# Patient Record
Sex: Male | Born: 1935 | Race: White | Hispanic: No | Marital: Married | Smoking: Never smoker
Health system: Southern US, Community
[De-identification: ages and names within clinical notes are randomized; demographics above are authoritative.]

## PROBLEM LIST (undated history)

## (undated) DIAGNOSIS — M109 Gout, unspecified: Secondary | ICD-10-CM

## (undated) DIAGNOSIS — I10 Essential (primary) hypertension: Secondary | ICD-10-CM

## (undated) DIAGNOSIS — F419 Anxiety disorder, unspecified: Secondary | ICD-10-CM

## (undated) DIAGNOSIS — C4491 Basal cell carcinoma of skin, unspecified: Secondary | ICD-10-CM

## (undated) DIAGNOSIS — R011 Cardiac murmur, unspecified: Secondary | ICD-10-CM

## (undated) DIAGNOSIS — I639 Cerebral infarction, unspecified: Secondary | ICD-10-CM

## (undated) HISTORY — DX: Basal cell carcinoma of skin, unspecified: C44.91

## (undated) HISTORY — DX: Gout, unspecified: M10.9

## (undated) HISTORY — PX: TOTAL HIP ARTHROPLASTY: SHX124

## (undated) HISTORY — DX: Anxiety disorder, unspecified: F41.9

## (undated) HISTORY — PX: HERNIA REPAIR: SHX51

## (undated) HISTORY — DX: Cardiac murmur, unspecified: R01.1

## (undated) HISTORY — DX: Essential (primary) hypertension: I10

## (undated) HISTORY — DX: Cerebral infarction, unspecified: I63.9

---

## 2004-12-31 ENCOUNTER — Other Ambulatory Visit: Payer: Self-pay

## 2004-12-31 ENCOUNTER — Emergency Department: Payer: Self-pay | Admitting: Unknown Physician Specialty

## 2006-10-22 ENCOUNTER — Emergency Department: Payer: Self-pay | Admitting: Emergency Medicine

## 2006-10-25 ENCOUNTER — Ambulatory Visit: Payer: Self-pay | Admitting: Family Medicine

## 2007-03-25 ENCOUNTER — Ambulatory Visit: Payer: Self-pay | Admitting: Unknown Physician Specialty

## 2007-03-25 ENCOUNTER — Other Ambulatory Visit: Payer: Self-pay

## 2007-04-12 ENCOUNTER — Ambulatory Visit: Payer: Self-pay | Admitting: Internal Medicine

## 2008-01-06 LAB — HM COLONOSCOPY: HM Colonoscopy: NORMAL

## 2008-04-07 ENCOUNTER — Emergency Department: Payer: Self-pay | Admitting: Emergency Medicine

## 2008-05-08 ENCOUNTER — Inpatient Hospital Stay: Payer: Self-pay | Admitting: Internal Medicine

## 2008-05-14 ENCOUNTER — Emergency Department: Payer: Self-pay | Admitting: Internal Medicine

## 2008-12-24 ENCOUNTER — Emergency Department: Payer: Self-pay | Admitting: Internal Medicine

## 2009-01-11 ENCOUNTER — Inpatient Hospital Stay: Payer: Self-pay | Admitting: Internal Medicine

## 2009-02-09 ENCOUNTER — Ambulatory Visit: Payer: Self-pay | Admitting: Internal Medicine

## 2009-03-09 ENCOUNTER — Ambulatory Visit: Payer: Self-pay | Admitting: Internal Medicine

## 2009-03-12 ENCOUNTER — Ambulatory Visit: Payer: Self-pay | Admitting: Internal Medicine

## 2009-04-11 ENCOUNTER — Ambulatory Visit: Payer: Self-pay | Admitting: Internal Medicine

## 2009-06-02 ENCOUNTER — Emergency Department: Payer: Self-pay | Admitting: Internal Medicine

## 2010-05-12 DIAGNOSIS — C4491 Basal cell carcinoma of skin, unspecified: Secondary | ICD-10-CM

## 2010-05-12 HISTORY — DX: Basal cell carcinoma of skin, unspecified: C44.91

## 2010-07-28 IMAGING — CT CT HEAD WITHOUT CONTRAST
2 series · 16 of 30 positions shown, 20 images · non-contrast
Comparison: none

REASON FOR EXAM: Syncope
COMMENTS:

[Series 2: without · axial · non-contrast · 0.42mm/px · z∈[-44,+80]mm · 13 of 31 slices shown, 17 images]
[im 3/31  brain]
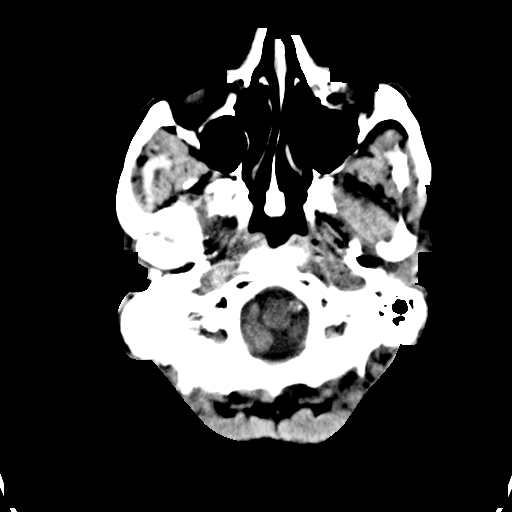
[im 3/31  bone]
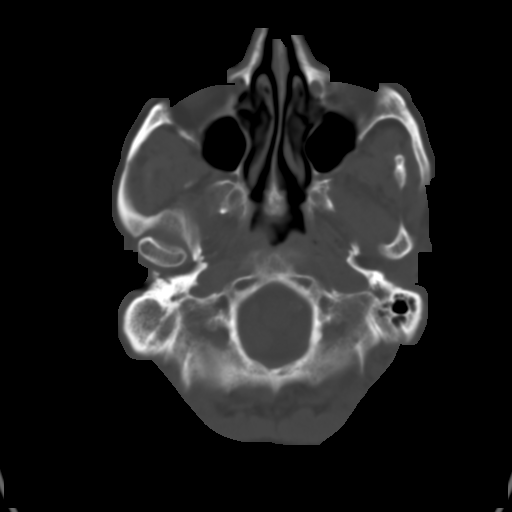
[im 5/31  brain]
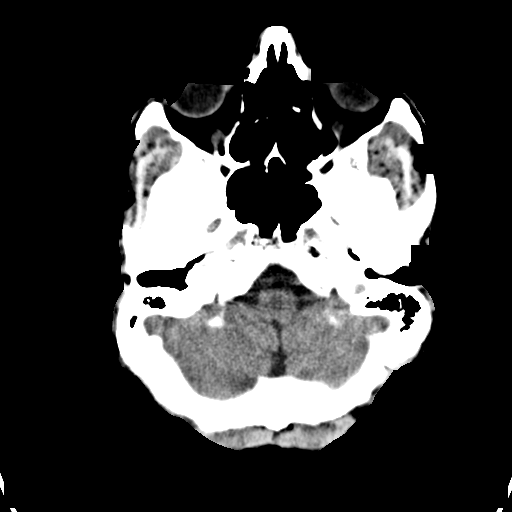
[im 7/31  brain]
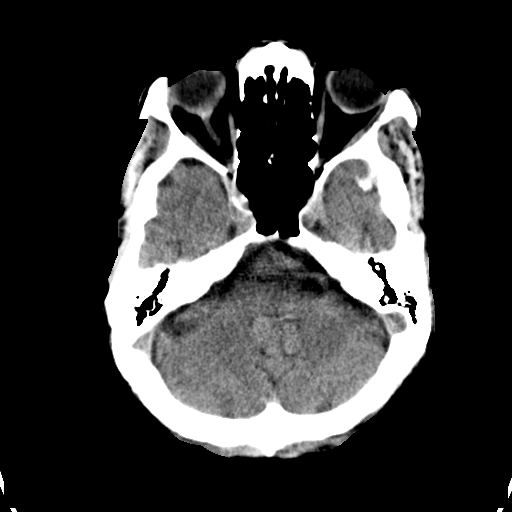
[im 9/31  brain]
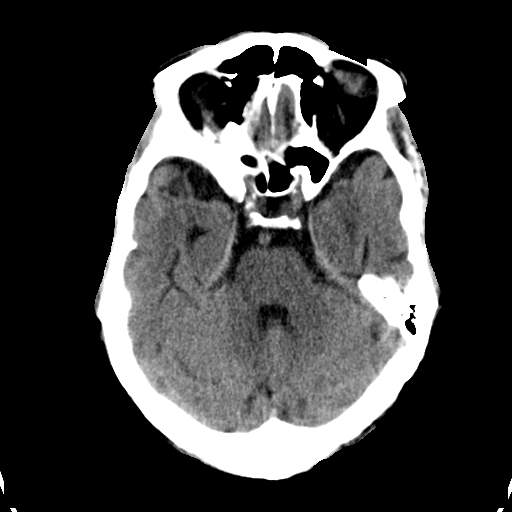
[im 11/31  brain]
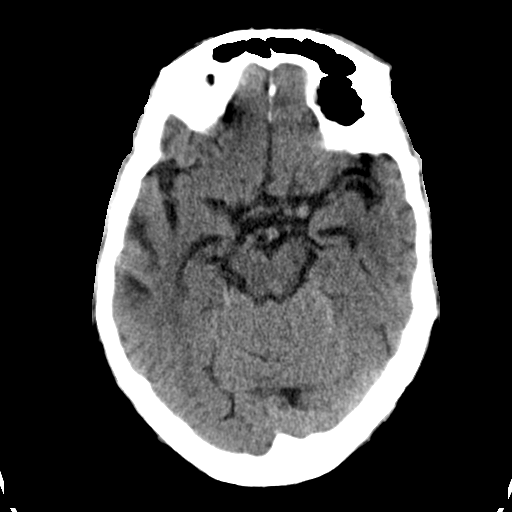
[im 11/31  bone]
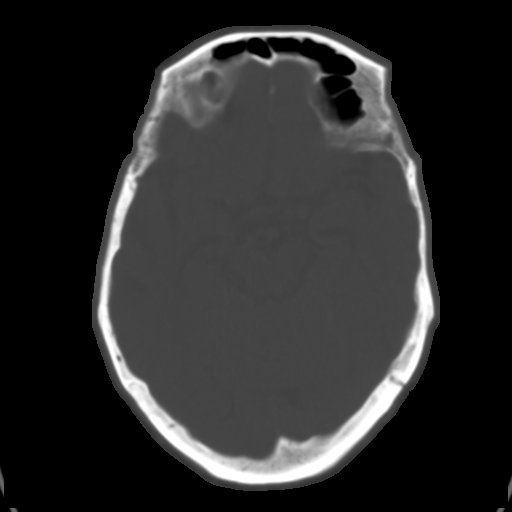
[im 13/31  brain]
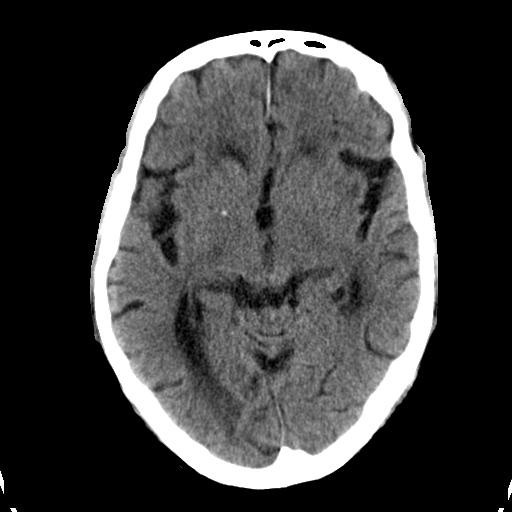
[im 16/31  brain]
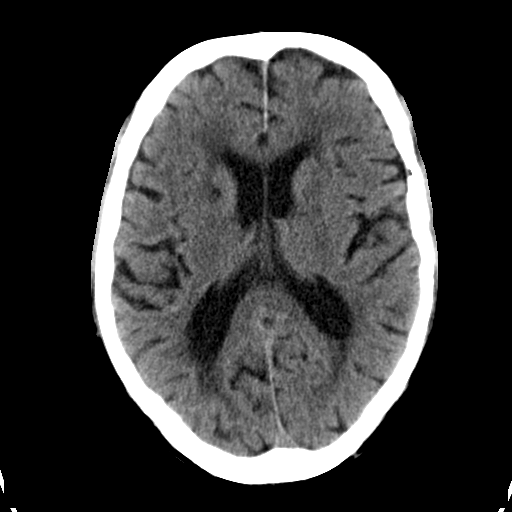
[im 18/31  brain]
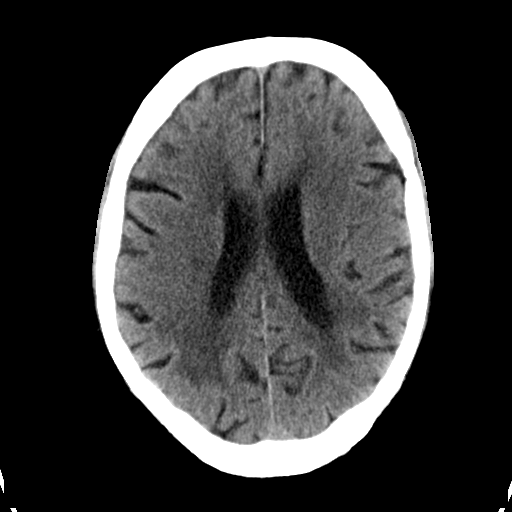
[im 20/31  brain]
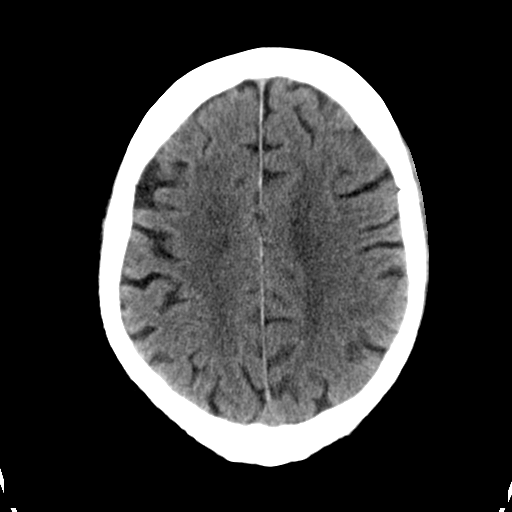
[im 20/31  bone]
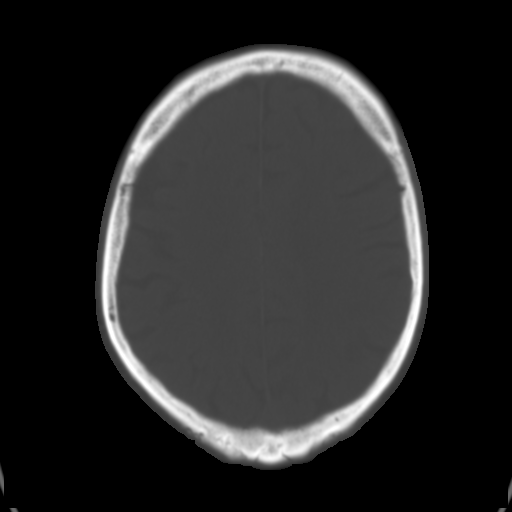
[im 22/31  brain]
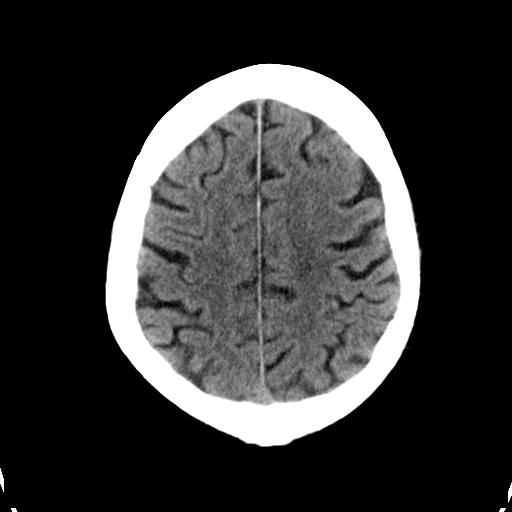
[im 24/31  brain]
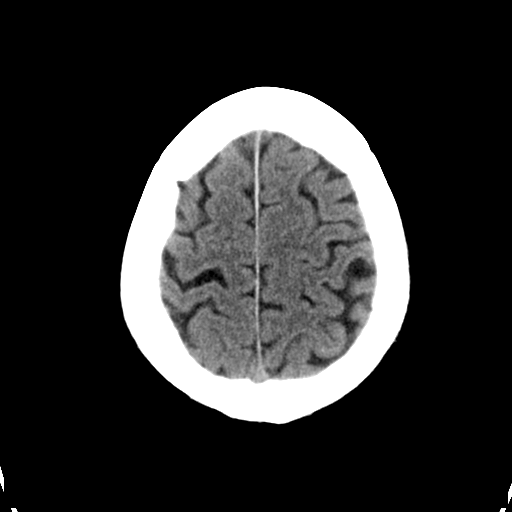
[im 26/31  brain]
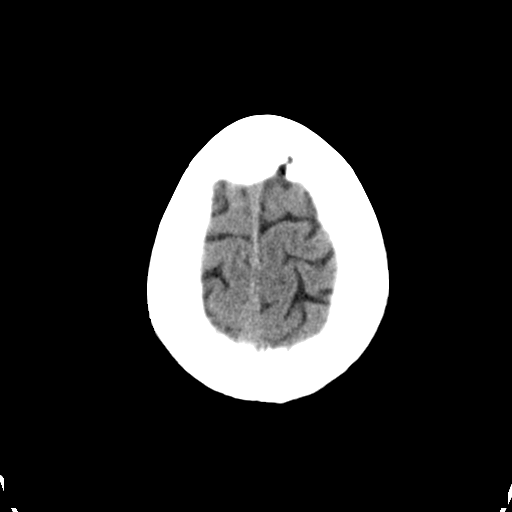
[im 28/31  brain]
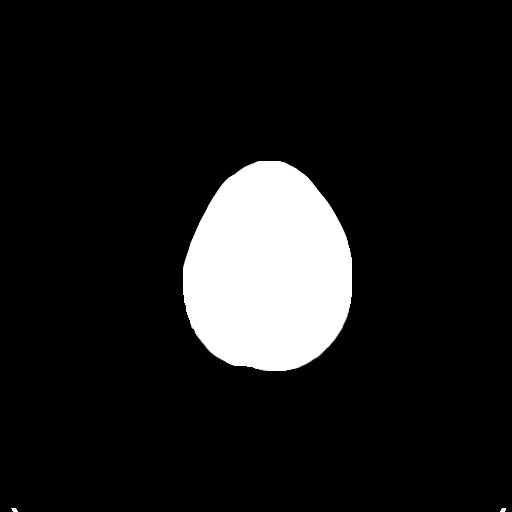
[im 28/31  bone]
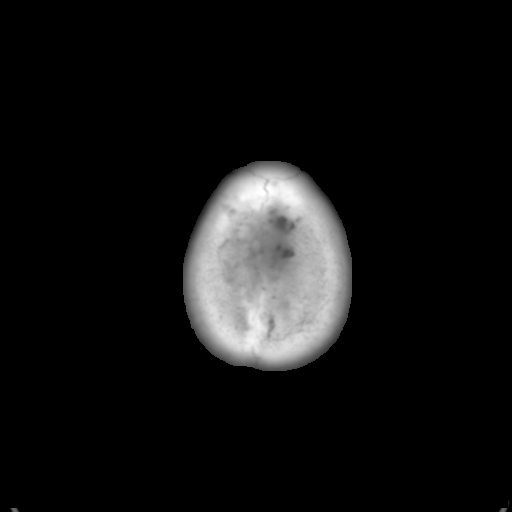

[Series 3: bone · axial · 0.42mm/px · z∈[-44,-4]mm · 3 of 31 slices shown]
[im 3/31  bone]
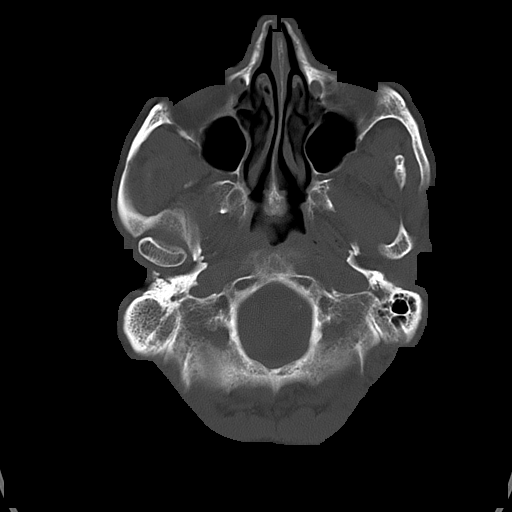
[im 7/31  bone]
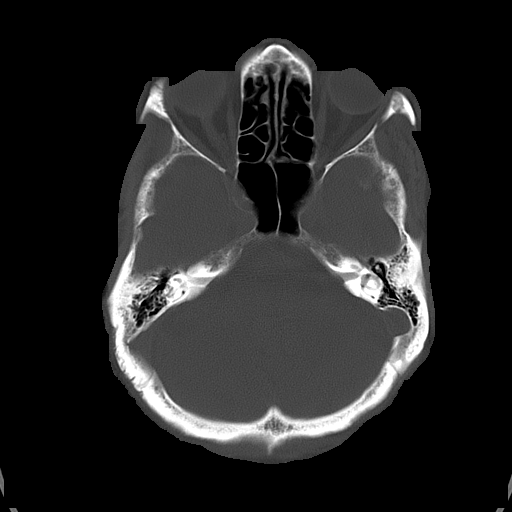
[im 11/31  bone]
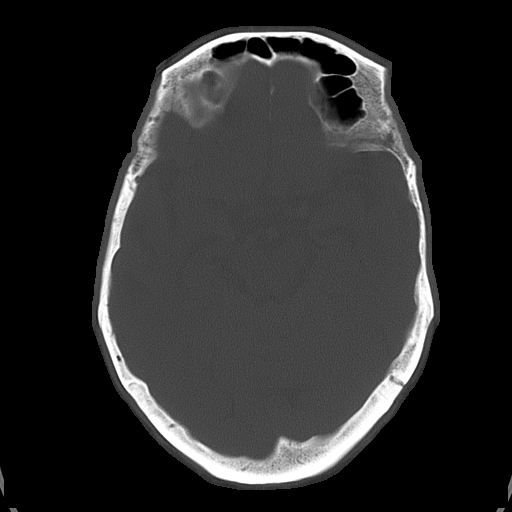

[16 of 30 positions shown; findings below may reference images not displayed]

PROCEDURE:     CT  - CT HEAD WITHOUT CONTRAST  - April 07, 2008  [DATE]

RESULT:     There is no evidence of intra-axial or extra-axial fluid
collections or evidence of acute hemorrhage. Areas of low attenuation
project within the subcortical deep and periventricular white matter
regions. There is mild diffuse cortical atrophy. The visualized bony
skeleton demonstrates no evidence of fracture or dislocation.
IMPRESSION: 1.     Involutional changes as described above.
2.     Note, not mentioned above, is a small focal punctate area of low
attenuation projecting within the RIGHT basal ganglia likely representing
the sequela of a chronic lacunar infarction.
3.     Chronic changes.
4.     No evidence of acute abnormalities.
5.     Dr. Drey of the Emergency Department was informed of these
findings via preliminary faxed report on 04/07/2008 at [DATE], [facility]
Standard Time.

## 2010-09-03 IMAGING — CR DG ABDOMEN 3V
1 series · 4 of 4 positions shown · non-contrast
Comparison: none

REASON FOR EXAM: abd pain.
COMMENTS:

[Series 1: view not recorded · 0.17mm/px · 4 of 4 slices shown]
[im 1/4]
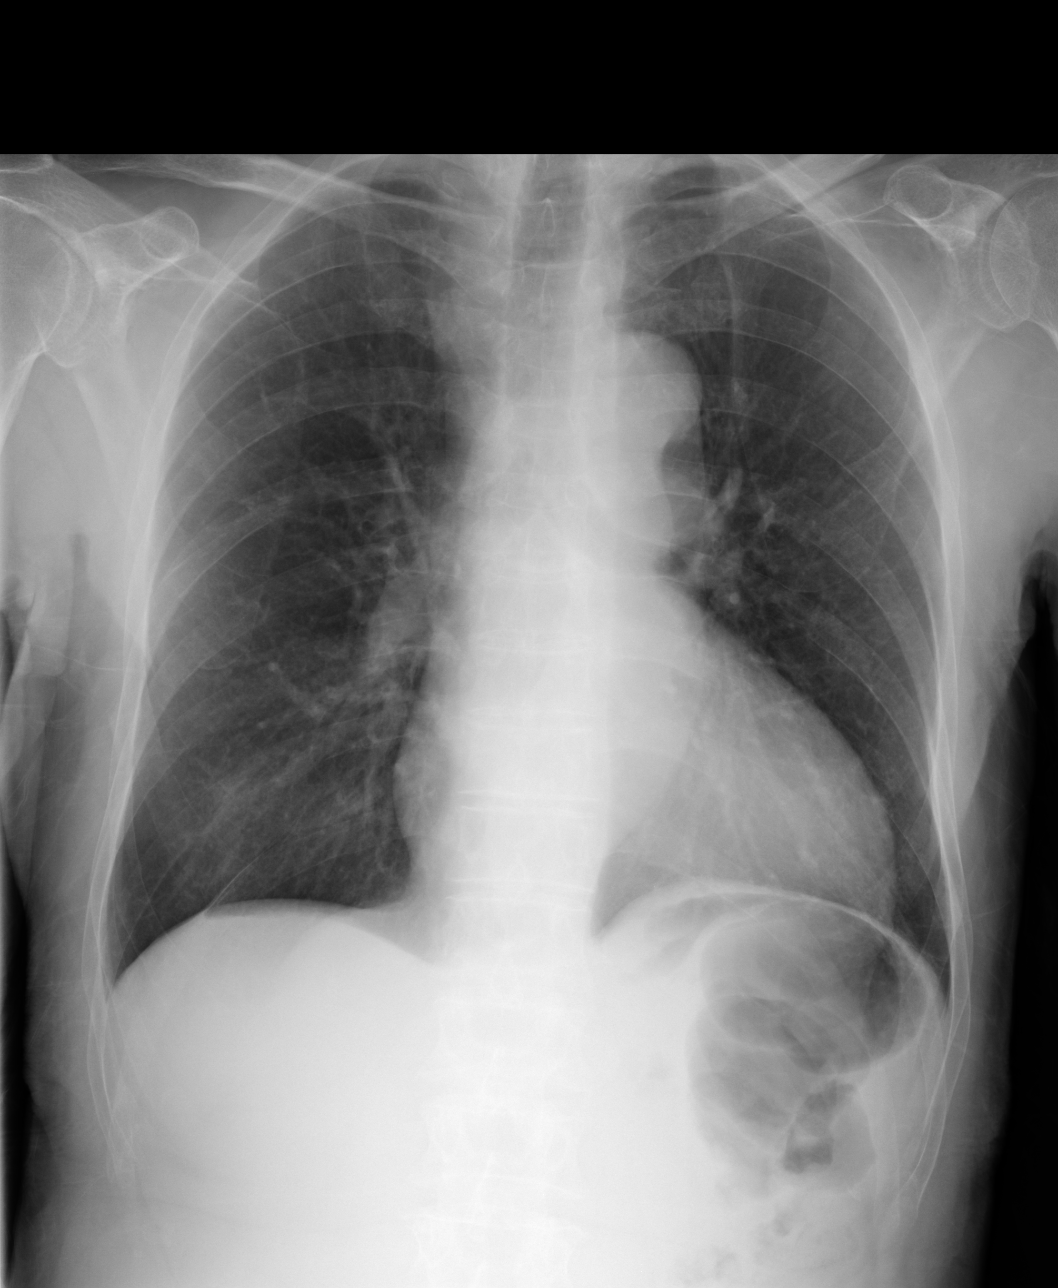
[im 2/4]
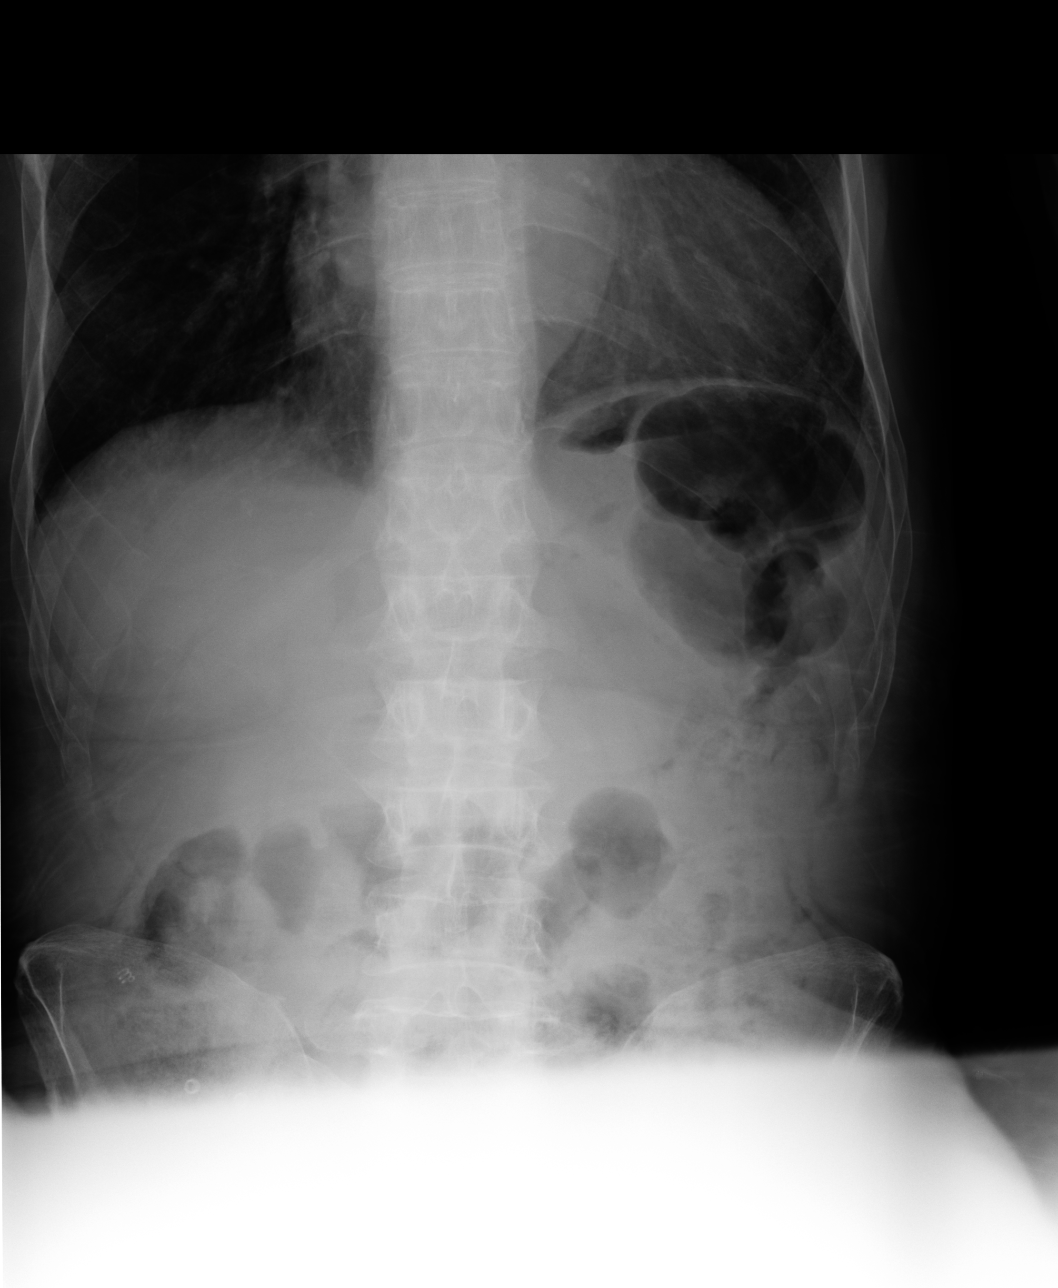
[im 3/4]
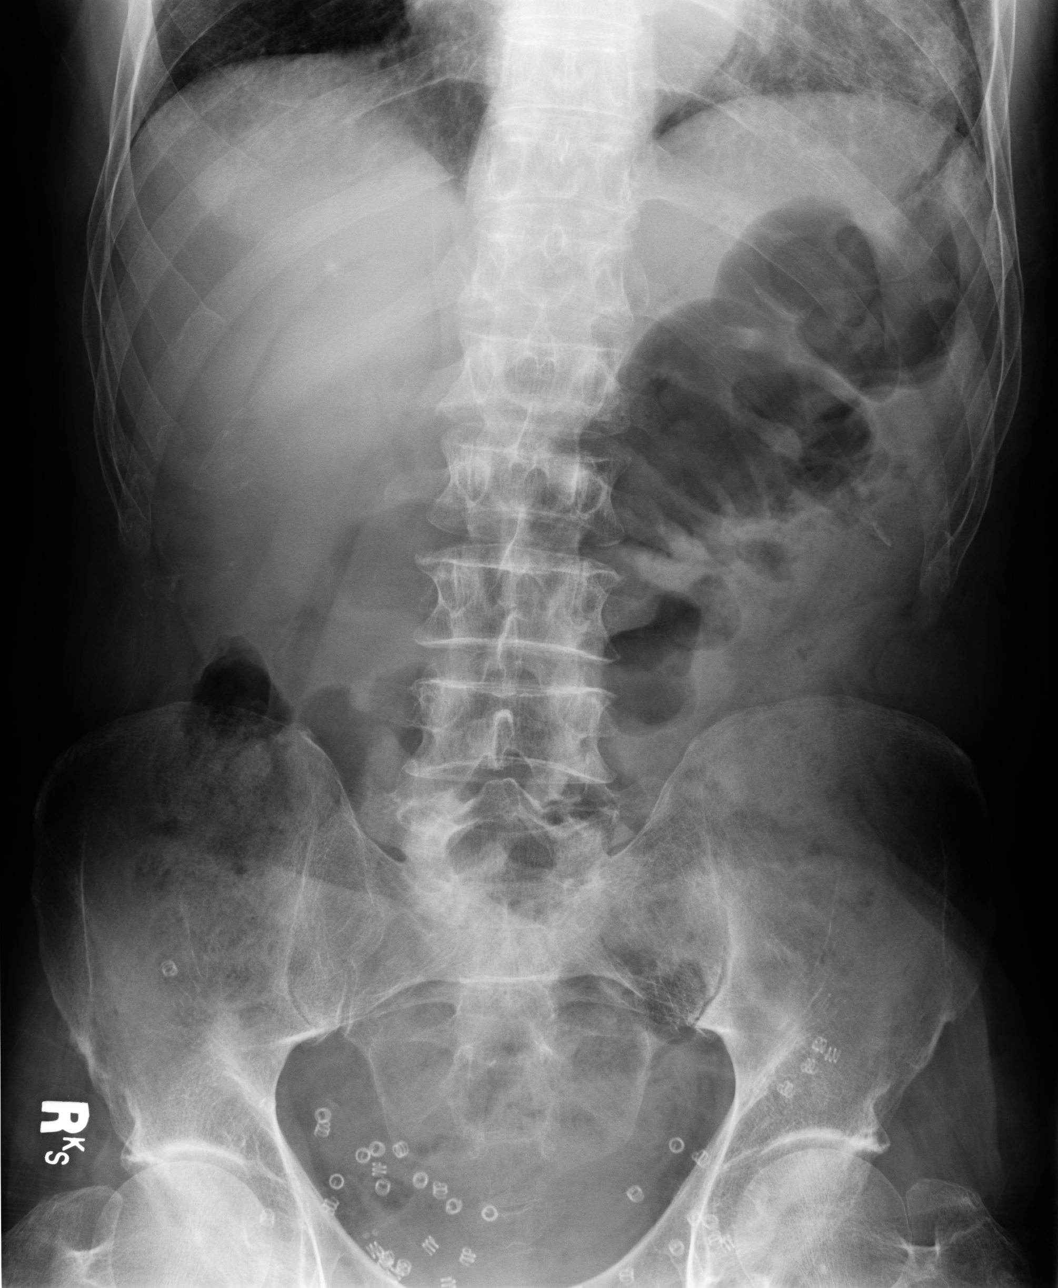
[im 4/4]
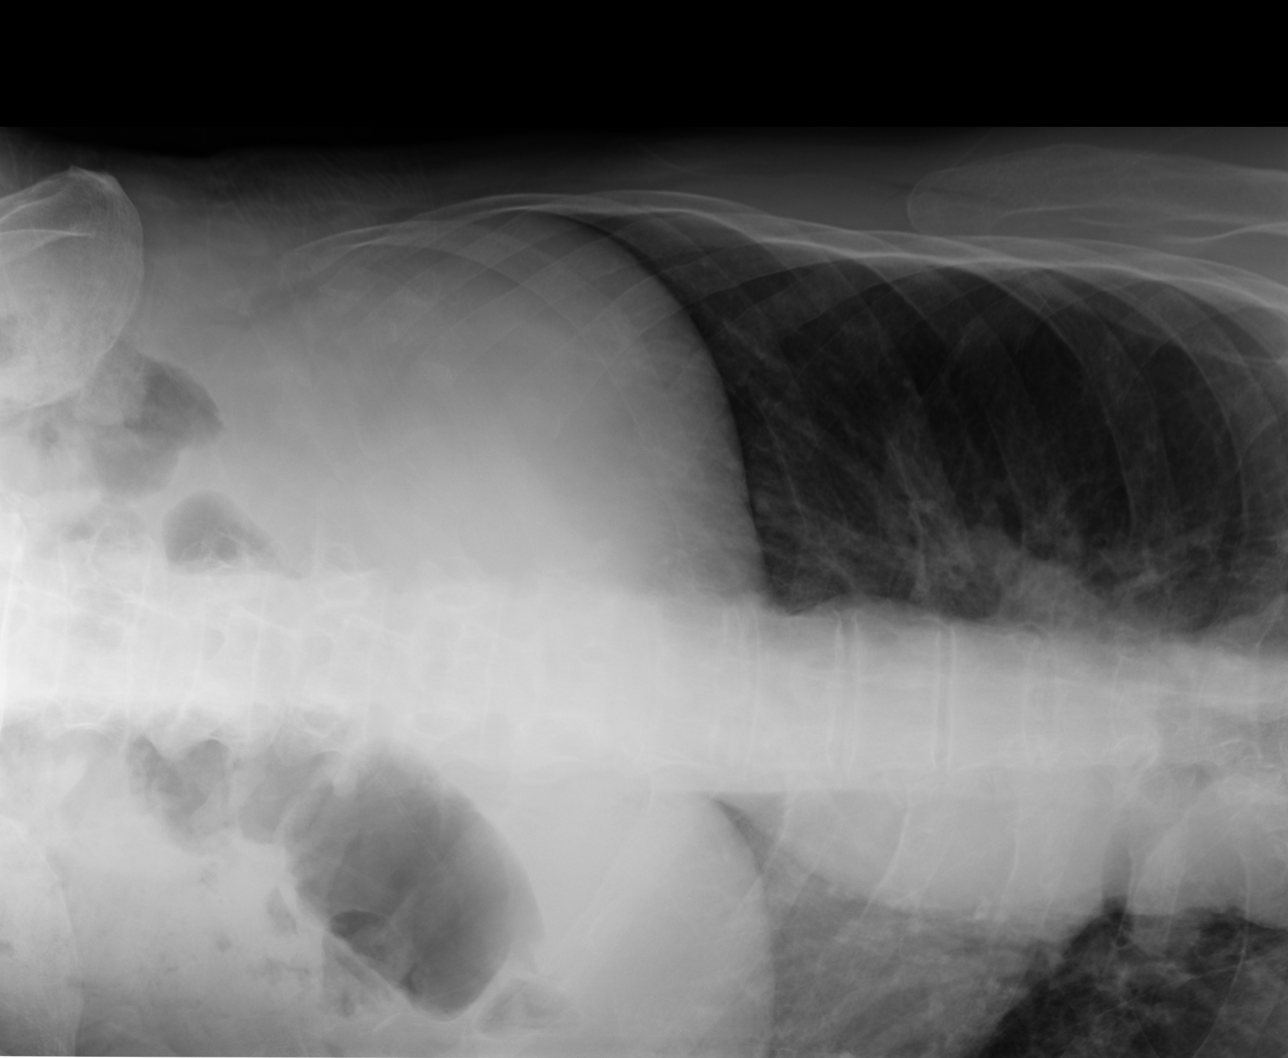

[4 of 4 positions shown; findings below may reference images not displayed]

PROCEDURE:     DXR - DXR ABDOMEN 3-WAY (INCL PA CXR)  - May 14, 2008  [DATE]

RESULT:     The lungs are well expanded and clear. The heart is normal in
size. There is mild tortuosity of the descending thoracic aorta. I do not
see evidence of a pleural effusion. Within the abdomen, there is a moderate
amount of stool and gas within the colon. A small amount of small bowel gas
is present as well. There are numerous surgical clips in the pelvis from
prior inguinal hernia repair. I see no free extraluminal gas collections.
The patient's known inguinal hernia on the RIGHT is not imaged on this study.
IMPRESSION: 1. The bowel gas pattern is within the limits of normal. I do not see
evidence of obstruction or perforation.
2. I do not see evidence of acute cardiopulmonary abnormality.

## 2010-10-14 ENCOUNTER — Emergency Department: Payer: Self-pay | Admitting: Emergency Medicine

## 2011-01-06 ENCOUNTER — Encounter: Payer: Self-pay | Admitting: Internal Medicine

## 2011-01-06 ENCOUNTER — Ambulatory Visit (INDEPENDENT_AMBULATORY_CARE_PROVIDER_SITE_OTHER): Payer: Medicare Other | Admitting: Internal Medicine

## 2011-01-06 DIAGNOSIS — I10 Essential (primary) hypertension: Secondary | ICD-10-CM

## 2011-01-06 DIAGNOSIS — F411 Generalized anxiety disorder: Secondary | ICD-10-CM

## 2011-01-06 DIAGNOSIS — F419 Anxiety disorder, unspecified: Secondary | ICD-10-CM | POA: Insufficient documentation

## 2011-01-06 MED ORDER — LISINOPRIL 10 MG PO TABS: 20.0000 mg | ORAL_TABLET | Freq: Every day | ORAL | Status: DC

## 2011-01-06 MED ORDER — ALPRAZOLAM 0.5 MG PO TABS: 0.5000 mg | ORAL_TABLET | Freq: Two times a day (BID) | ORAL | Status: DC | PRN

## 2011-01-06 NOTE — Patient Instructions (Signed)
Increase Lisinopril to 20mg  daily. Labs in 1 week. Follow up in 1 month.  Hypertension (High Blood Pressure) As your heart beats, it forces blood through your arteries. This force is your blood pressure. If the pressure is too high, it is called hypertension (HTN) or high blood pressure. HTN is dangerous because you may have it and not know it. High blood pressure may mean that your heart has to work harder to pump blood. Your arteries may be narrow or stiff. The extra work puts you at risk for heart disease, stroke, and other problems.  Blood pressure consists of two numbers, a higher number over a lower, 110/72, for example. It is stated as "110 over 72." The ideal is below 120 for the top number (systolic) and under 80 for the bottom (diastolic). Write down your blood pressure today. You should pay close attention to your blood pressure if you have certain conditions such as:  Heart failure.  Prior heart attack.   Diabetes   Chronic kidney disease.   Prior stroke.   Multiple risk factors for heart disease.   To see if you have HTN, your blood pressure should be measured while you are seated with your arm held at the level of the heart. It should be measured at least twice. A one-time elevated blood pressure reading (especially in the Emergency Department) does not mean that you need treatment. There may be conditions in which the blood pressure is different between your right and left arms. It is important to see your caregiver soon for a recheck. Most people have essential hypertension which means that there is not a specific cause. This type of high blood pressure may be lowered by changing lifestyle factors such as:  Stress.  Smoking.   Lack of exercise.   Excessive weight.  Drug/tobacco/alcohol use.   Eating less salt.   Most people do not have symptoms from high blood pressure until it has caused damage to the body. Effective treatment can often prevent, delay or reduce that  damage. TREATMENT Treatment for high blood pressure, when a cause has been identified, is directed at the cause. There are a large number of medications to treat HTN. These fall into several categories, and your caregiver will help you select the medicines that are best for you. Medications may have side effects. You should review side effects with your caregiver. If your blood pressure stays high after you have made lifestyle changes or started on medicines,   Your medication(s) may need to be changed.   Other problems may need to be addressed.   Be certain you understand your prescriptions, and know how and when to take your medicine.   Be sure to follow up with your caregiver within the time frame advised (usually within two weeks) to have your blood pressure rechecked and to review your medications.   If you are taking more than one medicine to lower your blood pressure, make sure you know how and at what times they should be taken. Taking two medicines at the same time can result in blood pressure that is too low.  SEEK IMMEDIATE MEDICAL CARE IF YOU DEVELOP:  A severe headache, blurred or changing vision, or confusion.   Unusual weakness or numbness, or a faint feeling.   Severe chest or abdominal pain, vomiting, or breathing problems.  MAKE SURE YOU:   Understand these instructions.   Will watch your condition.   Will get help right away if you are not doing well  or get worse.  Document Released: 04/28/2005 Document Re-Released: 10/16/2009 [facility] Patient Information 2011 [place], [place].

## 2011-01-06 NOTE — Progress Notes (Signed)
Subjective:    Patient ID: Ricky Vance, male    DOB: [birth year], 77 y.o.   MRN: [id number]  Ricky Vance is an 77YO male who presents to establish care.  He has most recently been followed by Dr. Thedore Mins in nephrology for his hypertension.     Hypertension This is a chronic problem. The current episode started more than 1 year ago. The problem has been gradually worsening since onset. The problem is uncontrolled. Associated symptoms include anxiety. Pertinent negatives include no chest pain, headaches, malaise/fatigue, neck pain, palpitations, peripheral edema or shortness of breath. There are no associated agents to hypertension. Risk factors for coronary artery disease include male gender. Past treatments include ACE inhibitors. The current treatment provides mild improvement. There are no compliance problems.  Hypertensive end-organ damage includes kidney disease (per pt report, followed by Dr. Thedore Mins).  Anxiety Presents for initial visit. Onset was more than 5 years ago. The problem has been unchanged. Symptoms include excessive worry, insomnia, muscle tension and nervous/anxious behavior. Patient reports no chest pain, dizziness, nausea, palpitations or shortness of breath. Symptoms occur occasionally. The severity of symptoms is moderate. The symptoms are aggravated by nothing. The quality of sleep is fair. Nighttime awakenings: occasional.   Risk factors include no known risk factors. His past medical history is significant for anxiety/panic attacks. Past treatments include benzodiazephines. The treatment provided significant relief. Compliance with prior treatments has been good.   Outpatient Encounter Prescriptions as of 01/06/2011  Medication Sig Dispense Refill  . ALPRAZolam (XANAX) 0.5 MG tablet Take 1 tablet (0.5 mg total) by mouth 2 (two) times daily as needed.  180 tablet  4  . celecoxib (CELEBREX) 100 MG capsule Take 100 mg by mouth 2 (two) times daily.        . cholecalciferol (VITAMIN  D) 1000 UNITS tablet Take 1,000 Units by mouth daily.        Marland Kitchen lisinopril (PRINIVIL,ZESTRIL) 10 MG tablet Take 2 tablets (20 mg total) by mouth daily.  90 tablet  4  . DISCONTD: ALPRAZolam (XANAX) 0.5 MG tablet Take 0.5 mg by mouth 2 (two) times daily as needed.        Marland Kitchen DISCONTD: lisinopril (PRINIVIL,ZESTRIL) 10 MG tablet Take 10 mg by mouth daily.           Review of Systems  Constitutional: Negative for fever, chills, malaise/fatigue, activity change, appetite change, fatigue and unexpected weight change.  HENT: Negative for neck pain.   Eyes: Negative for visual disturbance.  Respiratory: Negative for cough, chest tightness and shortness of breath.   Cardiovascular: Negative for chest pain, palpitations and leg swelling.  Gastrointestinal: Negative for nausea, vomiting, abdominal pain, diarrhea, constipation and abdominal distention.  Genitourinary: Negative for dysuria, urgency and difficulty urinating.  Musculoskeletal: Positive for gait problem (some unsteadiness, however continues to walk 1 hour per day). Negative for myalgias, back pain, joint swelling and arthralgias.  Skin: Negative for color change and rash.  Neurological: Positive for weakness. Negative for dizziness, speech difficulty and headaches.  Hematological: Negative for adenopathy.  Psychiatric/Behavioral: Positive for sleep disturbance. Negative for dysphoric mood. The patient is nervous/anxious and has insomnia.    BP 197/92  Pulse 57  Temp(Src) 98.3 F (36.8 C) (Oral)  Resp 12  Ht 5\' 8"  (1.727 m)  Wt 135 lb (61.236 kg)  BMI 20.53 kg/m2  SpO2 100% Repeat BP 170/92    Objective:   Physical Exam  Constitutional: He is oriented to person, place, and time. He appears well-developed  and well-nourished. No distress.  HENT:  Head: Normocephalic and atraumatic.  Right Ear: External ear normal.  Left Ear: External ear normal.  Nose: Nose normal.  Mouth/Throat: Oropharynx is clear and moist. No oropharyngeal  exudate.  Eyes: Conjunctivae and EOM are normal. Pupils are equal, round, and reactive to light. Right eye exhibits no discharge. Left eye exhibits no discharge. No scleral icterus.  Neck: Normal range of motion. Neck supple. No tracheal deviation present. No thyromegaly present.  Cardiovascular: Normal rate, regular rhythm and normal heart sounds.  Exam reveals no gallop and no friction rub.   No murmur heard. Pulmonary/Chest: Effort normal and breath sounds normal. No respiratory distress. He has no wheezes. He has no rales. He exhibits no tenderness.  Abdominal: Soft. Bowel sounds are normal. He exhibits no distension and no mass. There is no tenderness. There is no rebound and no guarding.  Musculoskeletal: Normal range of motion. He exhibits no edema.  Lymphadenopathy:    He has no cervical adenopathy.  Neurological: He is alert and oriented to person, place, and time. He has normal strength. No cranial nerve deficit. Coordination normal.       Gait slightly unstable  Skin: Skin is warm and dry. No rash noted. He is not diaphoretic. No erythema. No pallor.  Psychiatric: He has a normal mood and affect. His behavior is normal. Judgment and thought content normal. Cognition and memory are normal.          Assessment & Plan:  1. Hypertension - Pt with HTN, on lisinopril. Will request records from Dr. Doristine Church office as to previous evaluation and management. BP elevated today, and pt reports has been elevated at home. Will increase lisinopril to 20mg  daily. Repeat Cr and K in 1 week.  RTC in 2-4 weeks.  2. Anxiety - Per pt report, using alprazolam 0.5mg  bid prn.  We have requested records from pharmacy to confirm dose. Will refill once dose confirmed.  Discussed risks of sedation with alprazolam. Given pt age, would prefer to use another medication for anxiety, however pt reports chronic stability on this med, so will confirm with previous records. RTC 2-4 weeks.

## 2011-01-07 ENCOUNTER — Telehealth: Payer: Self-pay | Admitting: Internal Medicine

## 2011-01-07 NOTE — Telephone Encounter (Signed)
I spoke with Dr. Thedore Mins office and they haven't seen him since 2011 and they haven't Rx anything or have a record of the Xanax.  I called and spoke with the patient to let him know that he hasn't been Rx'd this medication and that I will fax his Lisinopril in.

## 2011-01-07 NOTE — Telephone Encounter (Signed)
Pt called he would like dr walker to schedule him a colonsocpy

## 2011-01-07 NOTE — Telephone Encounter (Signed)
OK. We should not refill xanax, until we see records from his previous physician. And, we should remove the medication from his chart.

## 2011-01-07 NOTE — Telephone Encounter (Signed)
I received this message back from you.  We should not refill xanax, and should remove it from med list.

## 2011-01-14 ENCOUNTER — Other Ambulatory Visit (INDEPENDENT_AMBULATORY_CARE_PROVIDER_SITE_OTHER): Payer: Medicare Other | Admitting: *Deleted

## 2011-01-14 DIAGNOSIS — I10 Essential (primary) hypertension: Secondary | ICD-10-CM

## 2011-01-14 LAB — COMPREHENSIVE METABOLIC PANEL
ALT: 28 U/L (ref 0–53)
AST: 33 U/L (ref 0–37)
Albumin: 3.8 g/dL (ref 3.5–5.2)
Alkaline Phosphatase: 110 U/L (ref 39–117)
Calcium: 9 mg/dL (ref 8.4–10.5)
Chloride: 101 mEq/L (ref 96–112)
Potassium: 4.3 mEq/L (ref 3.5–5.1)
Sodium: 138 mEq/L (ref 135–145)
Total Protein: 6.6 g/dL (ref 6.0–8.3)

## 2011-01-22 ENCOUNTER — Encounter: Payer: Self-pay | Admitting: Internal Medicine

## 2011-01-22 ENCOUNTER — Ambulatory Visit (INDEPENDENT_AMBULATORY_CARE_PROVIDER_SITE_OTHER): Payer: Medicare Other | Admitting: Internal Medicine

## 2011-01-22 VITALS — BP 206/91 | HR 52 | Temp 97.5°F | Resp 12 | Ht 68.0 in | Wt 135.8 lb

## 2011-01-22 DIAGNOSIS — M2550 Pain in unspecified joint: Secondary | ICD-10-CM

## 2011-01-22 DIAGNOSIS — F419 Anxiety disorder, unspecified: Secondary | ICD-10-CM

## 2011-01-22 DIAGNOSIS — I10 Essential (primary) hypertension: Secondary | ICD-10-CM

## 2011-01-22 DIAGNOSIS — F411 Generalized anxiety disorder: Secondary | ICD-10-CM

## 2011-01-22 MED ORDER — AMLODIPINE BESYLATE 5 MG PO TABS: 5.0000 mg | ORAL_TABLET | Freq: Every day | ORAL | Status: DC

## 2011-01-22 MED ORDER — CELECOXIB 100 MG PO CAPS: 100.0000 mg | ORAL_CAPSULE | Freq: Two times a day (BID) | ORAL | Status: DC

## 2011-01-22 MED ORDER — ALPRAZOLAM 0.5 MG PO TABS: 0.5000 mg | ORAL_TABLET | Freq: Two times a day (BID) | ORAL | Status: DC | PRN

## 2011-01-22 NOTE — Progress Notes (Signed)
Subjective:    Patient ID: Ricky Vance, male    DOB: 04/08/1928, 77 y.o.   MRN: [id number]  HPI  Ricky Vance is a an 77 year old male who presents to followup hypertension and anxiety. He has not been checking his blood pressure regularly since his last visit. He denies any chest pain, palpitations, headache or other complaints. He reports that he only recently filled his lisinopril, approximately 2 days ago.  At his last visit Ricky Vance reported a history of anxiety. He reported a history of use of Xanax. When we submitted his refill request, his pharmacy had not previously filled Xanax. In discussing this with him today he reports that he had been using his wife's Xanax mostly to help with sleep. He reports suffering from chronic insomnia which was improved considerably with the use of an occasional Xanax at bedtime. He would like to continue this.  Outpatient Prescriptions Prior to Visit  Medication Sig Dispense Refill  . cholecalciferol (VITAMIN D) 1000 UNITS tablet Take 1,000 Units by mouth daily.        Marland Kitchen lisinopril (PRINIVIL,ZESTRIL) 10 MG tablet Take 2 tablets (20 mg total) by mouth daily.  90 tablet  4  . ALPRAZolam (XANAX) 0.5 MG tablet Take 1 tablet (0.5 mg total) by mouth 2 (two) times daily as needed.  180 tablet  4  . celecoxib (CELEBREX) 100 MG capsule Take 100 mg by mouth 2 (two) times daily.          Review of Systems  Constitutional: Negative for fever, chills, activity change, appetite change, fatigue and unexpected weight change.  Eyes: Negative for visual disturbance.  Respiratory: Negative for cough, chest tightness and shortness of breath.   Cardiovascular: Negative for chest pain, palpitations and leg swelling.  Gastrointestinal: Negative for abdominal pain and abdominal distention.  Genitourinary: Negative for dysuria, urgency and difficulty urinating.  Musculoskeletal: Negative for arthralgias and gait problem.  Skin: Negative for color change and rash.    Neurological: Negative for headaches.  Hematological: Negative for adenopathy.  Psychiatric/Behavioral: Positive for sleep disturbance (chronic insomnia). Negative for dysphoric mood. The patient is not nervous/anxious.     BP 206/91  Pulse 52  Temp(Src) 97.5 F (36.4 C) (Oral)  Resp 12  Ht 5\' 8"  (1.727 m)  Wt 135 lb 12 oz (61.576 kg)  BMI 20.64 kg/m2  SpO2 99% Repeat BP by me 202/98    Objective:   Physical Exam  Constitutional: He is oriented to person, place, and time. He appears well-developed and well-nourished. No distress.  HENT:  Head: Normocephalic and atraumatic.  Right Ear: External ear normal.  Left Ear: External ear normal.  Nose: Nose normal.  Mouth/Throat: Oropharynx is clear and moist. No oropharyngeal exudate.  Eyes: Conjunctivae and EOM are normal. Pupils are equal, round, and reactive to light. Right eye exhibits no discharge. Left eye exhibits no discharge. No scleral icterus.  Neck: Normal range of motion. Neck supple. No tracheal deviation present. No thyromegaly present.  Cardiovascular: Normal rate, regular rhythm and normal heart sounds.  Exam reveals no gallop and no friction rub.   No murmur heard. Pulmonary/Chest: Effort normal and breath sounds normal. No respiratory distress. He has no wheezes. He has no rales. He exhibits no tenderness.  Musculoskeletal: Normal range of motion. He exhibits no edema.  Lymphadenopathy:    He has no cervical adenopathy.  Neurological: He is alert and oriented to person, place, and time. No cranial nerve deficit. Coordination normal.  Skin: Skin is warm  and dry. No rash noted. He is not diaphoretic. No erythema. No pallor.  Psychiatric: He has a normal mood and affect. His behavior is normal. Judgment and thought content normal.          Assessment & Plan:  1. Hypertension - patient with a long history of hypertension, which has historically been poorly controlled. He has been followed by Dr.  Thedore Mins recently in  nephrology. His recent renal function appears to be stable with a GFR of 43. I am reluctant to increase his dose of lisinopril because of renal insufficiency. We will give him a trial of amlodipine 5 mg daily to start. He will likely need to increase ultimately to 10 mg daily. We discussed that he may develop lower extremity edema on this medication and he will call this occurs. He will monitor his blood pressure daily and call on a weekly basis with his blood pressure readings. He prefers not to return to clinic on a monthly basis. Therefore, we will see him back in 3 months, and attempt to make medication adjustments as needed using his blood pressure readings.  2. Insomnia - Anxiety - patient reports a history of insomnia and anxiety. He recognizes that there are risks to using medication such as Xanax for insomnia. However, he has been using his wife's Xanax to help with sleep with no problems. He will continue to use Xanax when I called in a prescription today. He will followup in 3 months.

## 2011-01-22 NOTE — Patient Instructions (Signed)
Start Amlodipine 5mg  daily. Call weekly with Blood Pressure readings.  Call immediately if blood pressure consistently > 160/90. Return to clinic in 3 months.

## 2011-01-24 ENCOUNTER — Telehealth: Payer: Self-pay | Admitting: *Deleted

## 2011-01-24 NOTE — Telephone Encounter (Signed)
I received this message twice.  Please encourage him to continue to give [place] BP readings on a weekly basis. It looks like his BP has improved on medication.

## 2011-01-24 NOTE — Telephone Encounter (Signed)
Patient called to give you his blood pressure readings. He says that on wed it was 193/101 w/ pulse of 56, on Thursday it was 153/93 w/ pulse of 64, and on Thur. It was 149/84 w/ pulse of 62.

## 2011-01-24 NOTE — Telephone Encounter (Signed)
Patient notified

## 2011-02-03 ENCOUNTER — Telehealth: Payer: Self-pay | Admitting: Internal Medicine

## 2011-02-03 NOTE — Telephone Encounter (Signed)
Patient notified. Also he scheduled appt to see Dr. Rana Snare. He says that he wants labs done when he comes to see her.

## 2011-02-03 NOTE — Telephone Encounter (Signed)
Patient called and stated that his blood pressure average is 139/80 pulse is 62.  He wanted to know if in December when he comes in can we do a cholesterol and kidney panel on him.  Please advise.

## 2011-02-03 NOTE — Telephone Encounter (Signed)
That is fine  thanks

## 2011-02-11 ENCOUNTER — Telehealth: Payer: Self-pay | Admitting: Internal Medicine

## 2011-02-11 NOTE — Telephone Encounter (Signed)
This is excellent. I really appreciate him calling us with updates.

## 2011-02-11 NOTE — Telephone Encounter (Signed)
Patient called and stated his average for his Blood Pressure this week has been 136/78 and pulse of 60.

## 2011-02-12 NOTE — Telephone Encounter (Signed)
Left message for patient to return call.

## 2011-02-13 NOTE — Telephone Encounter (Signed)
Patient notified

## 2011-02-18 ENCOUNTER — Telehealth: Payer: Self-pay | Admitting: Internal Medicine

## 2011-02-18 NOTE — Telephone Encounter (Signed)
Pam, can you please look into this?

## 2011-02-18 NOTE — Telephone Encounter (Signed)
Pt called with bp readings 140/78 pulse 59 taking  half of tablet  For 7 days last week Pt would [facility]  [facility] 2 1/2 mg tablets

## 2011-02-26 ENCOUNTER — Telehealth: Payer: Self-pay | Admitting: Internal Medicine

## 2011-02-26 NOTE — Telephone Encounter (Signed)
Please call this in  

## 2011-02-26 NOTE — Telephone Encounter (Signed)
Patient is needing a new prescription called into [facility] for his Amlodipine 2 1/2 mg. He would also like a call to verify you have alled in his medication.

## 2011-02-28 MED ORDER — AMLODIPINE BESYLATE 5 MG PO TABS: 5.0000 mg | ORAL_TABLET | Freq: Every day | ORAL | Status: DC

## 2011-02-28 NOTE — Telephone Encounter (Signed)
See other phone note, DONE

## 2011-02-28 NOTE — Telephone Encounter (Signed)
DONE

## 2011-04-11 ENCOUNTER — Ambulatory Visit: Payer: Self-pay | Admitting: Ophthalmology

## 2011-04-11 ENCOUNTER — Other Ambulatory Visit: Payer: Self-pay | Admitting: Internal Medicine

## 2011-04-11 DIAGNOSIS — I10 Essential (primary) hypertension: Secondary | ICD-10-CM

## 2011-04-14 ENCOUNTER — Other Ambulatory Visit (INDEPENDENT_AMBULATORY_CARE_PROVIDER_SITE_OTHER): Payer: Medicare Other | Admitting: *Deleted

## 2011-04-14 DIAGNOSIS — I10 Essential (primary) hypertension: Secondary | ICD-10-CM

## 2011-04-14 LAB — COMPREHENSIVE METABOLIC PANEL
Alkaline Phosphatase: 115 U/L (ref 39–117)
BUN: 36 mg/dL — ABNORMAL HIGH (ref 6–23)
Glucose, Bld: 90 mg/dL (ref 70–99)
Sodium: 138 mEq/L (ref 135–145)
Total Bilirubin: 0.6 mg/dL (ref 0.3–1.2)
Total Protein: 6.8 g/dL (ref 6.0–8.3)

## 2011-04-17 ENCOUNTER — Telehealth: Payer: Self-pay | Admitting: *Deleted

## 2011-04-17 NOTE — Telephone Encounter (Signed)
Pt called regarding lab results. He stated that he thought that cholesterol should have been checked. Blood was already discarded so I offered to schedule another lab apt, he declined. Pt has apt on 12/17, lipids can be discussed at that time.

## 2011-04-22 ENCOUNTER — Ambulatory Visit: Payer: Self-pay | Admitting: Ophthalmology

## 2011-04-28 ENCOUNTER — Ambulatory Visit (INDEPENDENT_AMBULATORY_CARE_PROVIDER_SITE_OTHER): Payer: Medicare Other | Admitting: Internal Medicine

## 2011-04-28 ENCOUNTER — Encounter: Payer: Self-pay | Admitting: Internal Medicine

## 2011-04-28 VITALS — BP 156/70 | HR 54 | Temp 98.2°F | Wt 134.0 lb

## 2011-04-28 DIAGNOSIS — I10 Essential (primary) hypertension: Secondary | ICD-10-CM

## 2011-04-28 DIAGNOSIS — N189 Chronic kidney disease, unspecified: Secondary | ICD-10-CM

## 2011-04-28 DIAGNOSIS — Z1211 Encounter for screening for malignant neoplasm of colon: Secondary | ICD-10-CM

## 2011-04-28 NOTE — Progress Notes (Signed)
Subjective:    Patient ID: Ricky Vance, male    DOB: March 25, 1928, 77 y.o.   MRN: [id number]  HPI 77 year old male with a history of hypertension and chronic kidney disease presents for followup. He reports that he's been doing well. He notes that he has been walking between one and 2 hours on a daily basis to help improve his health. He has been recording his blood pressures which typically run between 120 and 140/60-80. He denies any headache, palpitations, shortness of breath, edema. He denies any chest pain. He reports full compliance with his medications.  He is interested in scheduling a screening colonoscopy. He reports that his last colonoscopy was 10 years ago. It was normal.  Outpatient Encounter Prescriptions as of 04/28/2011  Medication Sig Dispense Refill  . ALPRAZolam (XANAX) 0.5 MG tablet Take 1 tablet (0.5 mg total) by mouth 2 (two) times daily as needed.  180 tablet  4  . amLODipine (NORVASC) 5 MG tablet Take 2.5 mg by mouth daily.        . celecoxib (CELEBREX) 100 MG capsule Take 100 mg by mouth daily.        . cholecalciferol (VITAMIN D) 1000 UNITS tablet Take 1,000 Units by mouth daily.        Marland Kitchen lisinopril (PRINIVIL,ZESTRIL) 10 MG tablet Take 2 tablets (20 mg total) by mouth daily.  90 tablet  4    Review of Systems  Constitutional: Negative for fever, chills, activity change, appetite change, fatigue and unexpected weight change.  Eyes: Negative for visual disturbance.  Respiratory: Negative for cough and shortness of breath.   Cardiovascular: Negative for chest pain, palpitations and leg swelling.  Gastrointestinal: Negative for abdominal pain and abdominal distention.  Genitourinary: Negative for dysuria, urgency and difficulty urinating.  Musculoskeletal: Negative for arthralgias and gait problem.  Skin: Negative for color change and rash.  Hematological: Negative for adenopathy.  Psychiatric/Behavioral: Negative for sleep disturbance and dysphoric mood. The  patient is not nervous/anxious.    BP 156/70  Pulse 54  Temp(Src) 98.2 F (36.8 C) (Oral)  Wt 134 lb (60.782 kg)  SpO2 97%     Objective:   Physical Exam  Constitutional: He is oriented to person, place, and time. He appears well-developed and well-nourished. No distress.  HENT:  Head: Normocephalic and atraumatic.  Right Ear: External ear normal.  Left Ear: External ear normal.  Nose: Nose normal.  Mouth/Throat: Oropharynx is clear and moist. No oropharyngeal exudate.  Eyes: Conjunctivae and EOM are normal. Pupils are equal, round, and reactive to light. Right eye exhibits no discharge. Left eye exhibits no discharge. No scleral icterus.  Neck: Normal range of motion. Neck supple. No tracheal deviation present. No thyromegaly present.  Cardiovascular: Normal rate, regular rhythm and normal heart sounds.  Exam reveals no gallop and no friction rub.   No murmur heard. Pulmonary/Chest: Effort normal and breath sounds normal. No respiratory distress. He has no wheezes. He has no rales. He exhibits no tenderness.  Musculoskeletal: Normal range of motion. He exhibits no edema.  Lymphadenopathy:    He has no cervical adenopathy.  Neurological: He is alert and oriented to person, place, and time. No cranial nerve deficit. Coordination (slow, cautious gait) abnormal.  Skin: Skin is warm and dry. No rash noted. He is not diaphoretic. No erythema. No pallor.  Psychiatric: He has a normal mood and affect. His behavior is normal. Judgment and thought content normal.          Assessment &  Plan:  1. Hypertension - Pt reports excellent control of BP at home. BP slightly elevated today.  Will continue lisinopril and amlodipine. Reviewed recent renal function which was stable. Follow up in 6 months and prn.  2. Chronic kidney disease - stable. GFR near 45. On lisinopril. We'll continue to monitor. Will plan to repeat renal function in 6 months.  3. Screening for colon cancer - Will set up  referral for colonoscopy.

## 2011-05-19 ENCOUNTER — Telehealth: Payer: Self-pay | Admitting: Internal Medicine

## 2011-05-19 MED ORDER — AMLODIPINE BESYLATE 5 MG PO TABS: 2.5000 mg | ORAL_TABLET | Freq: Every day | ORAL | Status: DC

## 2011-05-19 NOTE — Telephone Encounter (Signed)
Patient needs a refill on his amlodipine 2.5 mg . Faxed to [facility] [phone].

## 2011-06-17 ENCOUNTER — Ambulatory Visit: Payer: Self-pay | Admitting: Ophthalmology

## 2011-07-29 ENCOUNTER — Other Ambulatory Visit: Payer: Self-pay | Admitting: Internal Medicine

## 2011-07-29 DIAGNOSIS — F419 Anxiety disorder, unspecified: Secondary | ICD-10-CM

## 2011-07-29 NOTE — Telephone Encounter (Signed)
FIne to fill.

## 2011-07-29 NOTE — Telephone Encounter (Signed)
Patient is needing his Alprazolam 0.5 mg and his amlodipine 2.5 mg sent into [facility] for a 90 day supply/refill.

## 2011-07-30 MED ORDER — AMLODIPINE BESYLATE 5 MG PO TABS: 2.5000 mg | ORAL_TABLET | Freq: Every day | ORAL | Status: DC

## 2011-07-30 MED ORDER — ALPRAZOLAM 0.5 MG PO TABS: 0.5000 mg | ORAL_TABLET | Freq: Two times a day (BID) | ORAL | Status: DC | PRN

## 2011-07-30 NOTE — Telephone Encounter (Signed)
To be faxed after signed by MD today

## 2011-08-14 ENCOUNTER — Telehealth: Payer: Self-pay | Admitting: Internal Medicine

## 2011-08-14 MED ORDER — AMLODIPINE BESYLATE 2.5 MG PO TABS: 2.5000 mg | ORAL_TABLET | Freq: Every day | ORAL | Status: DC

## 2011-08-14 NOTE — Telephone Encounter (Signed)
That is fine 

## 2011-08-14 NOTE — Telephone Encounter (Signed)
[phone]  Pt called needs rx for 2.5 amlodiphine  He was given 5mg  and told to cut in half.  He can not cut in half they are to small Can you send the 2.5 rx  90 supply for 1 year [facility]

## 2011-08-14 NOTE — Telephone Encounter (Signed)
Completed.

## 2011-08-14 NOTE — Telephone Encounter (Signed)
OK 

## 2011-10-01 ENCOUNTER — Telehealth: Payer: Self-pay | Admitting: Internal Medicine

## 2011-10-01 DIAGNOSIS — I10 Essential (primary) hypertension: Secondary | ICD-10-CM

## 2011-10-01 MED ORDER — LISINOPRIL 10 MG PO TABS: 20.0000 mg | ORAL_TABLET | Freq: Every day | ORAL | Status: DC

## 2011-10-01 NOTE — Telephone Encounter (Signed)
Rx printed for MD signature/SLS Rx faxed to [facility]; patient's wife informed/SLS

## 2011-10-01 NOTE — Telephone Encounter (Signed)
[phone] Pt needs refill on lisinapril 2 tablet per day 10mg  Needs rx for year Pt stated to send to [facility]  [phone]

## 2012-02-23 ENCOUNTER — Telehealth: Payer: Self-pay | Admitting: Internal Medicine

## 2012-02-23 ENCOUNTER — Ambulatory Visit (INDEPENDENT_AMBULATORY_CARE_PROVIDER_SITE_OTHER): Payer: Medicare Other | Admitting: Internal Medicine

## 2012-02-23 ENCOUNTER — Encounter: Payer: Self-pay | Admitting: Internal Medicine

## 2012-02-23 VITALS — BP 180/90 | HR 76 | Temp 97.8°F | Ht 68.0 in | Wt 130.8 lb

## 2012-02-23 DIAGNOSIS — L259 Unspecified contact dermatitis, unspecified cause: Secondary | ICD-10-CM

## 2012-02-23 DIAGNOSIS — I10 Essential (primary) hypertension: Secondary | ICD-10-CM

## 2012-02-23 DIAGNOSIS — E039 Hypothyroidism, unspecified: Secondary | ICD-10-CM

## 2012-02-23 DIAGNOSIS — L309 Dermatitis, unspecified: Secondary | ICD-10-CM | POA: Insufficient documentation

## 2012-02-23 MED ORDER — TRIAMCINOLONE 0.1 % CREAM:EUCERIN CREAM 1:1: 1.0000 "application " | TOPICAL_CREAM | Freq: Three times a day (TID) | CUTANEOUS | Status: DC | PRN

## 2012-02-23 NOTE — Patient Instructions (Signed)
Your rash seems most consistent with allergic reaction.  This may be caused by Celebrex. Please stop this medication.  Please apply triamcinolone cream 2 to 3 daily.  Call if rash is not improving over next 2 days.

## 2012-02-23 NOTE — Progress Notes (Signed)
Subjective:    Patient ID: Ricky Vance, male    DOB: 02-03-1928, 78 y.o.   MRN: [id number]  HPI 78 year old male with history of hypertension presents for acute visit complaining of four-day history of rash over his abdomen. The rash is described as red and occasionally itching. He has been applying topical hydrocortisone cream with slight improvement. He was concerned that this rash may represent shingles. Notably, he was seen by dermatology 5 days ago and rash was not present at that time. He denies use of any new lotions, creams, perfumes. He denies any new medications.  Outpatient Encounter Prescriptions as of 02/23/2012  Medication Sig Dispense Refill  . amLODipine (NORVASC) 2.5 MG tablet Take 1 tablet (2.5 mg total) by mouth daily.  90 tablet  3  . celecoxib (CELEBREX) 100 MG capsule Take 100 mg by mouth daily.        Marland Kitchen lisinopril (PRINIVIL,ZESTRIL) 10 MG tablet Take 2 tablets (20 mg total) by mouth daily.  180 tablet  3  . ALPRAZolam (XANAX) 0.5 MG tablet Take 1 tablet (0.5 mg total) by mouth 2 (two) times daily as needed.  180 tablet  1  . cholecalciferol (VITAMIN D) 1000 UNITS tablet Take 1,000 Units by mouth daily.        . Triamcinolone Acetonide (TRIAMCINOLONE 0.1 % CREAM : EUCERIN) CREA Apply 1 application topically 3 (three) times daily as needed.  1 each  0   BP 180/90  Pulse 76  Temp 97.8 F (36.6 C) (Oral)  Ht 5\' 8"  (1.727 m)  Wt 130 lb 12 oz (59.308 kg)  BMI 19.88 kg/m2  SpO2 96%  Review of Systems  Constitutional: Negative for fever, chills, activity change, appetite change, fatigue and unexpected weight change.  Eyes: Negative for visual disturbance.  Respiratory: Negative for cough and shortness of breath.   Cardiovascular: Negative for chest pain, palpitations and leg swelling.  Gastrointestinal: Negative for abdominal pain and abdominal distention.  Genitourinary: Negative for dysuria, urgency and difficulty urinating.  Musculoskeletal: Negative for  arthralgias and gait problem.  Skin: Positive for color change and rash.  Hematological: Negative for adenopathy.  Psychiatric/Behavioral: Negative for disturbed wake/sleep cycle and dysphoric mood. The patient is nervous/anxious.        Objective:   Physical Exam  Constitutional: He is oriented to person, place, and time. He appears well-developed and well-nourished. No distress.  HENT:  Head: Normocephalic and atraumatic.  Right Ear: External ear normal.  Left Ear: External ear normal.  Nose: Nose normal.  Mouth/Throat: Oropharynx is clear and moist. No oropharyngeal exudate.  Eyes: Conjunctivae normal and EOM are normal. Pupils are equal, round, and reactive to light. Right eye exhibits no discharge. Left eye exhibits no discharge. No scleral icterus.  Neck: Normal range of motion. Neck supple. No tracheal deviation present. No thyromegaly present.  Cardiovascular: Normal rate, regular rhythm and normal heart sounds.  Exam reveals no gallop and no friction rub.   No murmur heard. Pulmonary/Chest: Effort normal and breath sounds normal. No respiratory distress. He has no wheezes. He has no rales. He exhibits no tenderness.  Musculoskeletal: Normal range of motion. He exhibits no edema.  Lymphadenopathy:    He has no cervical adenopathy.  Neurological: He is alert and oriented to person, place, and time. No cranial nerve deficit. Coordination normal.  Skin: Skin is warm and dry. Rash noted. Rash is maculopapular (erythematous maculopapular rash over anterior chest/abdomen). He is not diaphoretic. No erythema. No pallor.  Psychiatric: He has  a normal mood and affect. His behavior is normal. Judgment and thought content normal.          Assessment & Plan:

## 2012-02-23 NOTE — Assessment & Plan Note (Signed)
Blood pressure markedly elevated today. Patient has been lost to followup for almost one year. Will check renal function with labs today. Will have patient monitor blood pressure at home. Followup in 2 weeks.

## 2012-02-23 NOTE — Telephone Encounter (Signed)
Caller: Kellar/Patient; Patient Name: Ricky Vance; PCP: Ronna Polio (Adults only); [facility] Callback Phone Number: [phone]; Reason for call: Other. Onset 02/20/12 Patient states he has shingles on abdomen.  Afebrile.  Patient refused triage stating he had already spoking with nurse and request  appt.  ASAP.  PLEASE FOLLOW UP WITH PATIENT CONCERNING APPT. THANK YOU.

## 2012-02-23 NOTE — Telephone Encounter (Signed)
Pt has shingles on waist and it is bothering him pretty badly. He wanted to speak to a nurse.

## 2012-02-23 NOTE — Assessment & Plan Note (Signed)
Rash noted on exam today is most consistent with contact dermatitis. Unclear allergen. Will have patient apply topical Eucerin/triamcinolone cream.  He'll also try stopping Celebrex as question whether this medication might have led to reaction.He will call if symptoms are not improving over next 72hr.

## 2012-02-23 NOTE — Telephone Encounter (Signed)
Pt has been put in at 2:30 today

## 2012-02-24 LAB — TSH: TSH: 2.14 u[IU]/mL (ref 0.35–5.50)

## 2012-02-24 LAB — COMPREHENSIVE METABOLIC PANEL
Albumin: 3.8 g/dL (ref 3.5–5.2)
Alkaline Phosphatase: 156 U/L — ABNORMAL HIGH (ref 39–117)
BUN: 37 mg/dL — ABNORMAL HIGH (ref 6–23)
CO2: 27 mEq/L (ref 19–32)
Calcium: 9.3 mg/dL (ref 8.4–10.5)
Chloride: 101 mEq/L (ref 96–112)
Glucose, Bld: 86 mg/dL (ref 70–99)
Potassium: 5.2 mEq/L — ABNORMAL HIGH (ref 3.5–5.1)
Sodium: 135 mEq/L (ref 135–145)
Total Protein: 7.3 g/dL (ref 6.0–8.3)

## 2012-02-25 ENCOUNTER — Telehealth: Payer: Self-pay | Admitting: Internal Medicine

## 2012-02-25 NOTE — Telephone Encounter (Signed)
Pt called to get lab results Can leave message

## 2012-02-26 NOTE — Telephone Encounter (Signed)
Pt informed of lab results yesterday-see result note. Called to schedule Lab appointment to have BMP rechecked on Monday. Closing phone note.

## 2012-02-27 ENCOUNTER — Other Ambulatory Visit: Payer: Self-pay

## 2012-02-27 DIAGNOSIS — I10 Essential (primary) hypertension: Secondary | ICD-10-CM

## 2012-02-27 DIAGNOSIS — L259 Unspecified contact dermatitis, unspecified cause: Secondary | ICD-10-CM

## 2012-03-01 ENCOUNTER — Other Ambulatory Visit (INDEPENDENT_AMBULATORY_CARE_PROVIDER_SITE_OTHER): Payer: Medicare Other

## 2012-03-01 DIAGNOSIS — L259 Unspecified contact dermatitis, unspecified cause: Secondary | ICD-10-CM

## 2012-03-01 DIAGNOSIS — I10 Essential (primary) hypertension: Secondary | ICD-10-CM

## 2012-03-01 LAB — COMPREHENSIVE METABOLIC PANEL
ALT: 11 U/L (ref 0–53)
Alkaline Phosphatase: 147 U/L — ABNORMAL HIGH (ref 39–117)
CO2: 29 mEq/L (ref 19–32)
Creatinine, Ser: 1.6 mg/dL — ABNORMAL HIGH (ref 0.4–1.5)
GFR: 43.23 mL/min — ABNORMAL LOW (ref >60.00)
Sodium: 135 mEq/L (ref 135–145)
Total Bilirubin: 0.6 mg/dL (ref 0.3–1.2)

## 2012-03-02 ENCOUNTER — Telehealth: Payer: Self-pay | Admitting: Internal Medicine

## 2012-03-02 NOTE — Telephone Encounter (Signed)
Received fax of patient's appointment with [facility] on 10.22.13 . Patient has an appointment on 10.30.13 @ 8:20 with Dr. Thedore Mins. Faxed labs via Epic.

## 2012-03-03 ENCOUNTER — Other Ambulatory Visit: Payer: Self-pay | Admitting: *Deleted

## 2012-03-03 NOTE — Telephone Encounter (Signed)
Received faxed refill request for  90 day supply of Hydrocortisone cream 20GM 2.5 % ( no dosing instructions on fax). This med is not on pt's med list. Is it ok to fill?  Since this is a mail order med and not on med list I am routing this request to Dr Dan Humphreys to advise upon her return.

## 2012-03-05 NOTE — Telephone Encounter (Signed)
OK to fill

## 2012-03-10 MED ORDER — HYDROCORTISONE 2.5 % EX CREA: 1.0000 "application " | TOPICAL_CREAM | Freq: Two times a day (BID) | CUTANEOUS | Status: DC

## 2012-03-10 NOTE — Telephone Encounter (Signed)
Rx sent to [facility]

## 2012-03-11 ENCOUNTER — Telehealth: Payer: Self-pay | Admitting: Internal Medicine

## 2012-03-11 NOTE — Telephone Encounter (Signed)
error 

## 2012-05-28 ENCOUNTER — Telehealth: Payer: Self-pay | Admitting: Internal Medicine

## 2012-06-04 ENCOUNTER — Other Ambulatory Visit: Payer: Self-pay | Admitting: Internal Medicine

## 2012-06-04 MED ORDER — AMLODIPINE BESYLATE 5 MG PO TABS: 5.0000 mg | ORAL_TABLET | Freq: Every day | ORAL | Status: DC

## 2012-06-04 NOTE — Telephone Encounter (Signed)
amLODipine Besylate tabs 5 mg  #90

## 2012-06-04 NOTE — Telephone Encounter (Signed)
Fine to fill Amlodipine 5mg  #90 with 3 refills

## 2012-06-04 NOTE — Telephone Encounter (Signed)
Rx refill sent to [facility] electronically. Pt is aware.

## 2012-06-04 NOTE — Telephone Encounter (Signed)
Spoke to pt, asked if needs refill on Amlodipine? Pt stated yes and is taking 5 mg daily Kidney doctor increased the doseage. Told pt okay, I will check with Dr. Dan Humphreys and make sure okay to order the script. Pt verbalized understanding.

## 2012-09-27 ENCOUNTER — Other Ambulatory Visit: Payer: Self-pay | Admitting: Internal Medicine

## 2012-09-29 ENCOUNTER — Other Ambulatory Visit: Payer: Self-pay | Admitting: *Deleted

## 2012-09-29 MED ORDER — HYDROCORTISONE 2.5 % EX CREA: TOPICAL_CREAM | Freq: Two times a day (BID) | CUTANEOUS | Status: DC

## 2012-09-29 NOTE — Telephone Encounter (Signed)
Escribed

## 2012-10-26 ENCOUNTER — Telehealth: Payer: Self-pay | Admitting: Internal Medicine

## 2012-10-26 ENCOUNTER — Ambulatory Visit: Payer: Self-pay | Admitting: Internal Medicine

## 2012-10-26 ENCOUNTER — Ambulatory Visit (INDEPENDENT_AMBULATORY_CARE_PROVIDER_SITE_OTHER): Payer: Medicare Other | Admitting: Internal Medicine

## 2012-10-26 ENCOUNTER — Encounter: Payer: Self-pay | Admitting: Internal Medicine

## 2012-10-26 ENCOUNTER — Telehealth: Payer: Self-pay | Admitting: *Deleted

## 2012-10-26 VITALS — BP 140/80 | HR 62 | Temp 98.2°F | Wt 128.0 lb

## 2012-10-26 DIAGNOSIS — R600 Localized edema: Secondary | ICD-10-CM | POA: Insufficient documentation

## 2012-10-26 DIAGNOSIS — R609 Edema, unspecified: Secondary | ICD-10-CM

## 2012-10-26 DIAGNOSIS — I10 Essential (primary) hypertension: Secondary | ICD-10-CM

## 2012-10-26 LAB — COMPREHENSIVE METABOLIC PANEL
BUN: 32 mg/dL — ABNORMAL HIGH (ref 6–23)
CO2: 26 mEq/L (ref 19–32)
Calcium: 9.8 mg/dL (ref 8.4–10.5)
Chloride: 101 mEq/L (ref 96–112)
Creatinine, Ser: 1.6 mg/dL — ABNORMAL HIGH (ref 0.4–1.5)
GFR: 42.85 mL/min — ABNORMAL LOW (ref >60.00)
Total Bilirubin: 0.7 mg/dL (ref 0.3–1.2)

## 2012-10-26 NOTE — Telephone Encounter (Signed)
Pt called stated he went to the [facility] as instructed by dr walker,  He had the ultra sound done and the circulation is great.  Everything was ok.  Pt wanted to know what to do about the swelling in the feet and legs. Please advise [facility] number [phone]

## 2012-10-26 NOTE — Progress Notes (Signed)
  Subjective:    Patient ID: Ricky Vance, male    DOB: 04/14/1928, 79 y.o.   MRN: [id number]  HPI 79 year old male with history of hypertension, noncompliance presents for acute visit complaining of right greater than left lower swelling over the last week. He denies any change in medication. He reports compliance of amlodipine. He denies any change in dietary intake of salt. He denies any pain in his legs, specifically any pain in his calves. He denies any trauma to his legs. He has not changed his activity level over the last few weeks. He has no history of blood clot.  Outpatient Encounter Prescriptions as of 10/26/2012  Medication Sig Dispense Refill  . amLODipine (NORVASC) 5 MG tablet Take 1 tablet (5 mg total) by mouth daily.  90 tablet  3   No facility-administered encounter medications on file as of 10/26/2012.   BP 140/80  Pulse 62  Temp(Src) 98.2 F (36.8 C) (Oral)  Wt 128 lb (58.06 kg)  BMI 19.47 kg/m2  SpO2 98%  Review of Systems  Constitutional: Negative for fever, chills, activity change, appetite change, fatigue and unexpected weight change.  Eyes: Negative for visual disturbance.  Respiratory: Negative for cough and shortness of breath.   Cardiovascular: Positive for leg swelling. Negative for chest pain and palpitations.  Gastrointestinal: Negative for abdominal pain and abdominal distention.  Genitourinary: Negative for dysuria, urgency and difficulty urinating.  Musculoskeletal: Negative for arthralgias and gait problem.  Skin: Negative for color change and rash.  Hematological: Negative for adenopathy.  Psychiatric/Behavioral: Negative for sleep disturbance and dysphoric mood. The patient is not nervous/anxious.        Objective:   Physical Exam  Constitutional: He is oriented to person, place, and time. He appears well-developed and well-nourished. No distress.  HENT:  Head: Normocephalic and atraumatic.  Right Ear: External ear normal.  Left Ear: External  ear normal.  Nose: Nose normal.  Mouth/Throat: Oropharynx is clear and moist. No oropharyngeal exudate.  Eyes: Conjunctivae and EOM are normal. Pupils are equal, round, and reactive to light. Right eye exhibits no discharge. Left eye exhibits no discharge. No scleral icterus.  Neck: Normal range of motion. Neck supple. No tracheal deviation present. No thyromegaly present.  Cardiovascular: Normal rate, regular rhythm and normal heart sounds.  Exam reveals no gallop and no friction rub.   No murmur heard. Pulmonary/Chest: Effort normal and breath sounds normal. No respiratory distress. He has no wheezes. He has no rales. He exhibits no tenderness.  Musculoskeletal: Normal range of motion. He exhibits edema (right LE +2 pitting edema to below knee, left LE trace edema).  Lymphadenopathy:    He has no cervical adenopathy.  Neurological: He is alert and oriented to person, place, and time. No cranial nerve deficit. Coordination normal.  Skin: Skin is warm and dry. No rash noted. He is not diaphoretic. No erythema. No pallor.  Psychiatric: He has a normal mood and affect. His behavior is normal. Judgment and thought content normal.          Assessment & Plan:

## 2012-10-26 NOTE — Telephone Encounter (Signed)
Either way is fine. The cardiology may have an ultrasound tech there and be able to get [place] of lower extremities if necessary.

## 2012-10-26 NOTE — Telephone Encounter (Signed)
Call a nurse called stating she is booking patient at 27 AM for bilateral increased edema patient stated he wants an X- Ray to diagnose problem, and patient does not want to be referred out.

## 2012-10-26 NOTE — Assessment & Plan Note (Signed)
BP Readings from Last 3 Encounters:  10/26/12 140/80  02/23/12 180/90  04/28/11 156/70   BP fairly well controlled. Pt non-compliant with follow up and medications. Will check renal function with labs today.

## 2012-10-26 NOTE — Telephone Encounter (Signed)
error 

## 2012-10-26 NOTE — Telephone Encounter (Signed)
Patient called back and would like to know should he go to his cardiologist instead of coming here today?

## 2012-10-26 NOTE — Telephone Encounter (Signed)
Fwd to Dr. Walker 

## 2012-10-26 NOTE — Assessment & Plan Note (Signed)
BLE swelling right>left. Lower extremity doppler is negative for DVT. Swelling likely secondary to chronic venous insufficiency and perhaps exacerbated by use of Amlodipine. Will check renal function, albumin, with labs today. Encourage pt to use compression hose and keep legs elevated. Encouraged avoidance of salt.

## 2012-10-26 NOTE — Telephone Encounter (Signed)
Spoke with patient, he does not have a cardiologist. He has never been established with one before, informed patient to keep his appointment with Dr. Dan Humphreys today and he verbally agreed.

## 2012-10-26 NOTE — Telephone Encounter (Signed)
Informed patient of results and per Dr. Dan Humphreys may use compression stockings and keep legs elevated as much as possible. Once she gets the lab results back then will decide what else may be done

## 2012-10-26 NOTE — Telephone Encounter (Signed)
Patient Information:  Caller Name: Christophe  Phone: [phone]  Patient: Quay Burow  Gender: Male  DOB: 1927-10-25  Age: 79 Years  PCP: Ronna Polio (Adults only)  Office Follow Up:  Does the office need to follow up with this patient?: No  Instructions For The Office: N/A  RN Note:  Edema noted in the last 3-4 days bilaterally calf to feet. No pain, discoloration or numbness and tingling. The right foot has some "tightness", using a cane. He is requesting "an exay of feet and legs and then corrective action". RN/CAN scheduled appointment for today, 10/26/2012 @ 11:00 with Dr. Dan Humphreys and spoke to "[place]" the nurse, and let her know what he was requesting and to let her know he does not want to come in if just for a "referral". He states he spent thousands of unneeded dollars already. [facility]on accepted appointment.   Symptoms  Reason For Call & Symptoms: swelling of feet and legs  Reviewed Health History In EMR: Yes  Reviewed Medications In EMR: Yes  Reviewed Allergies In EMR: Yes  Reviewed Surgeries / Procedures: Yes  Date of Onset of Symptoms: 10/23/2012  Treatments Tried: Epsioon salt BID  Treatments Tried Worked: No  Guideline(s) Used:  Leg Swelling and Edema  Disposition Per Guideline:   See Today in Office  Reason For Disposition Reached:   Patient wants to be seen  Advice Given:  Expected Course:  If your leg swelling does not get better during the next week or if it recurs, make an appointment with your doctor.  Call Back If:  Swelling becomes worse  Swelling becomes red or painful to the touch  Calf pain occurs and becomes constant  Patient Will Follow Care Advice:  YES  Appointment Scheduled:  10/26/2012 11:00:00 Appointment Scheduled Provider:  Ronna Polio (Adults only)

## 2012-10-27 ENCOUNTER — Encounter: Payer: Self-pay | Admitting: Internal Medicine

## 2012-10-28 ENCOUNTER — Telehealth: Payer: Self-pay | Admitting: Internal Medicine

## 2012-10-28 NOTE — Telephone Encounter (Addendum)
Patient dismissed from [facility] by [facility] MD , effective October 27 2012. Dismissal letter sent out by certified / registered mail. DJ  Received signed domestic return receipt verifying delivery of certified letter on October 29 2012. Article number [id number]

## 2012-11-10 ENCOUNTER — Encounter: Payer: Self-pay | Admitting: Internal Medicine

## 2012-11-26 ENCOUNTER — Encounter: Payer: Self-pay | Admitting: *Deleted

## 2012-12-01 ENCOUNTER — Encounter: Payer: Self-pay | Admitting: *Deleted

## 2012-12-02 ENCOUNTER — Encounter: Payer: Self-pay | Admitting: Cardiovascular Disease

## 2012-12-02 ENCOUNTER — Ambulatory Visit (INDEPENDENT_AMBULATORY_CARE_PROVIDER_SITE_OTHER): Payer: Medicare Other | Admitting: Cardiovascular Disease

## 2012-12-02 VITALS — BP 142/78 | HR 53 | Ht 68.5 in | Wt 128.5 lb

## 2012-12-02 DIAGNOSIS — I10 Essential (primary) hypertension: Secondary | ICD-10-CM

## 2012-12-02 DIAGNOSIS — R600 Localized edema: Secondary | ICD-10-CM

## 2012-12-02 DIAGNOSIS — R0989 Other specified symptoms and signs involving the circulatory and respiratory systems: Secondary | ICD-10-CM

## 2012-12-02 DIAGNOSIS — R609 Edema, unspecified: Secondary | ICD-10-CM

## 2012-12-02 DIAGNOSIS — R011 Cardiac murmur, unspecified: Secondary | ICD-10-CM | POA: Insufficient documentation

## 2012-12-02 NOTE — Assessment & Plan Note (Signed)
I suspect he may have mild aortic valve stenosis. Might only have valve sclerosis with no stenosis. Echocardiogram has been ordered.

## 2012-12-02 NOTE — Assessment & Plan Note (Signed)
Blood pressure adequate today. No changes made to his medications.

## 2012-12-02 NOTE — Progress Notes (Signed)
   Patient ID: Ricky Vance, male    DOB: December 10, 1927, 79 y.o.   MRN: [id number]  HPI Comments: Ricky Vance is a 79 year old gentleman with history of hypertension, chronic renal insufficiency, lower stone edema, who presents by referral for leg swelling, murmur.  Recently seen by Dr. Marguerite Olea. He was told that he had a murmur. He denies any significant shortness of breath. He has been battling lower chimney edema. Recently started Lasix 2 weeks ago 20 mg daily. Edema has improved. Still with mild edema, worse compression hose bilaterally in the daytime. He is been on amlodipine for several years, uncertain if there have been a dose change recently. He states the swelling only seem to occur in the middle of June 2014. Denies any chest pain, PND or orthopnea. He states that he used to be a runner and is otherwise very fit  Previous ultrasound of his legs 10/26/2012 showed no DVT Baseline creatinine 1.6 TSH 4.9, albumin 4.5, normal LFTs, hematocrit 48 EKG shows normal sinus rhythm with rate 53 beats per minute, left axis deviation, unable to exclude old anteroseptal infarct    Outpatient Encounter Prescriptions as of 12/02/2012  Medication Sig Dispense Refill  . allopurinol (ZYLOPRIM) 100 MG tablet Take 100 mg by mouth daily.      Marland Kitchen amLODipine (NORVASC) 2.5 MG tablet Take 2.5 mg by mouth daily.      . furosemide (LASIX) 20 MG tablet Take 20 mg by mouth.       Review of Systems  Constitutional: Negative.   HENT: Negative.   Eyes: Negative.   Respiratory: Negative.   Cardiovascular: Positive for leg swelling.  Gastrointestinal: Negative.   Musculoskeletal: Positive for gait problem.  Skin: Negative.   Neurological: Negative.   Psychiatric/Behavioral: Negative.   All other systems reviewed and are negative.    BP 142/78  Pulse 53  Ht 5' 8.5" (1.74 m)  Wt 128 lb 8 oz (58.287 kg)  BMI 19.25 kg/m2  Physical Exam  Nursing note and vitals reviewed. Constitutional: He is oriented to  person, place, and time. He appears well-developed and well-nourished.  Walks with a cane, high fall risk. Difficulty transferring from chair to table  HENT:  Head: Normocephalic.  Nose: Nose normal.  Mouth/Throat: Oropharynx is clear and moist.  Eyes: Conjunctivae are normal. Pupils are equal, round, and reactive to light.  Neck: Normal range of motion. Neck supple. No JVD present. Carotid bruit is present.  Cardiovascular: Normal rate, regular rhythm, S1 normal, S2 normal and intact distal pulses.  Exam reveals no gallop and no friction rub.   Murmur heard.  Systolic murmur is present with a grade of 2/6  Pulmonary/Chest: Effort normal and breath sounds normal. No respiratory distress. He has no wheezes. He has no rales. He exhibits no tenderness.  Abdominal: Soft. Bowel sounds are normal. He exhibits no distension. There is no tenderness.  Musculoskeletal: Normal range of motion. He exhibits edema. He exhibits no tenderness.  Lymphadenopathy:    He has no cervical adenopathy.  Neurological: He is alert and oriented to person, place, and time. Coordination normal.  Skin: Skin is warm and dry. No rash noted. No erythema.  Psychiatric: He has a normal mood and affect. His behavior is normal. Judgment and thought content normal.      Assessment and Plan

## 2012-12-02 NOTE — Assessment & Plan Note (Signed)
Carotid ultrasound has been ordered for bruits. If clear, bruit could be secondary to radiating cardiac murmur.

## 2012-12-02 NOTE — Assessment & Plan Note (Addendum)
Trace pitting edema on today's visit. Impression hose in place. It would seem that the Lasix has helped his lower edema. He feels that edema is much better. I suggested he decrease her Lasix down to every other day once his edema has resolved to avoid dehydration. Recent potassium last week was adequate but I have encouraged him to increase his banana intake when he takes a diuretic. If symptoms recur, could change his amlodipine to alternate blood pressure medication as this could be contributing to his edema.  We'll evaluate echocardiogram for elevated right ventricular systolic pressures. He may have diastolic dysfunction, possible diastolic CHF.

## 2012-12-02 NOTE — Patient Instructions (Addendum)
You are doing well.  Please continue to take Furosemide/lasix daily for any leg swelling If swelling resolves, take lasix every other day Take  a banana when you take your lasix  Continue to wear your compression socks  Please call us if you have new issues that need to be addressed before your next appt.  Your physician wants you to follow-up in: 3 months.  You will receive a reminder letter in the mail two months in advance. If you don't receive a letter, please call our office to schedule the follow-up appointment.

## 2012-12-03 ENCOUNTER — Telehealth: Payer: Self-pay | Admitting: *Deleted

## 2012-12-03 NOTE — Telephone Encounter (Signed)
Pt understands Dr. Ethelene Hal' recommendations about eating a banana when/if he takes his furosemide. Mylo Red RN

## 2012-12-03 NOTE — Telephone Encounter (Signed)
Patient would like to speak with nurse concerning eating bananas and it affecting his potassium along with his other medications.

## 2012-12-08 ENCOUNTER — Encounter (INDEPENDENT_AMBULATORY_CARE_PROVIDER_SITE_OTHER): Payer: Medicare Other

## 2012-12-08 ENCOUNTER — Other Ambulatory Visit (INDEPENDENT_AMBULATORY_CARE_PROVIDER_SITE_OTHER): Payer: Medicare Other

## 2012-12-08 ENCOUNTER — Other Ambulatory Visit: Payer: Self-pay

## 2012-12-08 DIAGNOSIS — R600 Localized edema: Secondary | ICD-10-CM

## 2012-12-08 DIAGNOSIS — I359 Nonrheumatic aortic valve disorder, unspecified: Secondary | ICD-10-CM

## 2012-12-08 DIAGNOSIS — R011 Cardiac murmur, unspecified: Secondary | ICD-10-CM

## 2012-12-08 DIAGNOSIS — R0989 Other specified symptoms and signs involving the circulatory and respiratory systems: Secondary | ICD-10-CM

## 2012-12-08 DIAGNOSIS — I059 Rheumatic mitral valve disease, unspecified: Secondary | ICD-10-CM

## 2012-12-08 DIAGNOSIS — R609 Edema, unspecified: Secondary | ICD-10-CM

## 2012-12-10 ENCOUNTER — Telehealth: Payer: Self-pay | Admitting: *Deleted

## 2012-12-10 NOTE — Telephone Encounter (Signed)
Patient is having swelling of the feet and ankles. Please advise

## 2012-12-10 NOTE — Telephone Encounter (Signed)
Pt has gout spoke with pharmacist and they recommended Uloric by rx to pt.  Pt was calling to see if Dr Mariah Milling would rx this medication for him.  Advised pt we do not treat gout.  He should call his primary MD for gout tx and possible Uloric rx.  Pt verbalized understanding.

## 2012-12-15 ENCOUNTER — Inpatient Hospital Stay: Payer: Self-pay | Admitting: Student

## 2012-12-15 LAB — CBC: RDW: 13.6 % (ref 11.5–14.5)

## 2012-12-15 LAB — COMPREHENSIVE METABOLIC PANEL
Alkaline Phosphatase: 249 U/L — ABNORMAL HIGH (ref 50–136)
Anion Gap: 3 — ABNORMAL LOW (ref 7–16)
BUN: 42 mg/dL — ABNORMAL HIGH (ref 7–18)
Calcium, Total: 9.2 mg/dL (ref 8.5–10.1)
Chloride: 101 mmol/L (ref 98–107)
Co2: 31 mmol/L (ref 21–32)
Creatinine: 2 mg/dL — ABNORMAL HIGH (ref 0.60–1.30)
Glucose: 112 mg/dL — ABNORMAL HIGH (ref 65–99)
Potassium: 4.3 mmol/L (ref 3.5–5.1)
Sodium: 135 mmol/L — ABNORMAL LOW (ref 136–145)
Total Protein: 7.4 g/dL (ref 6.4–8.2)

## 2012-12-15 LAB — PROTIME-INR
INR: 1.1
Prothrombin Time: 14.1 secs (ref 11.5–14.7)

## 2012-12-15 LAB — APTT: Activated PTT: 32.3 secs (ref 23.6–35.9)

## 2012-12-16 LAB — CBC WITH DIFFERENTIAL/PLATELET
Basophil #: 0 10*3/uL (ref 0.0–0.1)
Basophil %: 0.5 %
Eosinophil #: 0.1 10*3/uL (ref 0.0–0.7)
Eosinophil %: 1.2 %
HCT: 35.8 % — ABNORMAL LOW (ref 40.0–52.0)
HGB: 12.2 g/dL — ABNORMAL LOW (ref 13.0–18.0)
Lymphocyte #: 0.7 10*3/uL — ABNORMAL LOW (ref 1.0–3.6)
Lymphocyte %: 9.2 %
MCH: 30.5 pg (ref 26.0–34.0)
MCHC: 34.2 g/dL (ref 32.0–36.0)
MCV: 89 fL (ref 80–100)
Monocyte #: 0.6 x10 3/mm (ref 0.2–1.0)
Monocyte %: 7.5 %
Neutrophil %: 81.6 %
WBC: 7.5 10*3/uL (ref 3.8–10.6)

## 2012-12-16 LAB — BASIC METABOLIC PANEL
BUN: 37 mg/dL — ABNORMAL HIGH (ref 7–18)
Chloride: 105 mmol/L (ref 98–107)
Co2: 27 mmol/L (ref 21–32)
EGFR (African American): 44 — ABNORMAL LOW
Glucose: 120 mg/dL — ABNORMAL HIGH (ref 65–99)
Sodium: 137 mmol/L (ref 136–145)

## 2012-12-16 LAB — MAGNESIUM: Magnesium: 2 mg/dL

## 2012-12-17 LAB — CBC WITH DIFFERENTIAL/PLATELET
Basophil #: 0 10*3/uL (ref 0.0–0.1)
Eosinophil %: 0.5 %
Lymphocyte #: 0.5 10*3/uL — ABNORMAL LOW (ref 1.0–3.6)
Lymphocyte %: 6.1 %
MCH: 30.5 pg (ref 26.0–34.0)
MCHC: 34.1 g/dL (ref 32.0–36.0)
MCV: 89 fL (ref 80–100)
Monocyte %: 7 %
Neutrophil %: 86.2 %
WBC: 8.6 10*3/uL (ref 3.8–10.6)

## 2012-12-17 LAB — BASIC METABOLIC PANEL
Anion Gap: 6 — ABNORMAL LOW (ref 7–16)
BUN: 30 mg/dL — ABNORMAL HIGH (ref 7–18)
Calcium, Total: 8.2 mg/dL — ABNORMAL LOW (ref 8.5–10.1)
Chloride: 106 mmol/L (ref 98–107)
Creatinine: 1.45 mg/dL — ABNORMAL HIGH (ref 0.60–1.30)
EGFR (African American): 51 — ABNORMAL LOW
EGFR (Non-African Amer.): 44 — ABNORMAL LOW
Osmolality: 281 (ref 275–301)
Potassium: 4.2 mmol/L (ref 3.5–5.1)
Sodium: 137 mmol/L (ref 136–145)

## 2012-12-18 LAB — BASIC METABOLIC PANEL
Anion Gap: 2 — ABNORMAL LOW (ref 7–16)
Calcium, Total: 8.4 mg/dL — ABNORMAL LOW (ref 8.5–10.1)
Co2: 29 mmol/L (ref 21–32)
EGFR (African American): 41 — ABNORMAL LOW
Osmolality: 269 (ref 275–301)
Potassium: 5.1 mmol/L (ref 3.5–5.1)
Sodium: 131 mmol/L — ABNORMAL LOW (ref 136–145)

## 2012-12-18 LAB — HEMOGLOBIN: HGB: 9.8 g/dL — ABNORMAL LOW (ref 13.0–18.0)

## 2012-12-19 LAB — CBC WITH DIFFERENTIAL/PLATELET
Basophil #: 0 10*3/uL (ref 0.0–0.1)
Eosinophil #: 0 10*3/uL (ref 0.0–0.7)
HCT: 25.3 % — ABNORMAL LOW (ref 40.0–52.0)
HGB: 8.9 g/dL — ABNORMAL LOW (ref 13.0–18.0)
Lymphocyte #: 0.4 10*3/uL — ABNORMAL LOW (ref 1.0–3.6)
MCV: 89 fL (ref 80–100)
Monocyte #: 0.5 x10 3/mm (ref 0.2–1.0)
Monocyte %: 6.3 %
Neutrophil #: 7 10*3/uL — ABNORMAL HIGH (ref 1.4–6.5)
Platelet: 132 10*3/uL — ABNORMAL LOW (ref 150–440)
RDW: 13.6 % (ref 11.5–14.5)
WBC: 8 10*3/uL (ref 3.8–10.6)

## 2012-12-19 LAB — OSMOLALITY: Osmolality: 268 mOsm/kg — ABNORMAL LOW (ref 280–301)

## 2012-12-19 LAB — BASIC METABOLIC PANEL
Anion Gap: 6 — ABNORMAL LOW (ref 7–16)
BUN: 33 mg/dL — ABNORMAL HIGH (ref 7–18)
Chloride: 96 mmol/L — ABNORMAL LOW (ref 98–107)
Co2: 25 mmol/L (ref 21–32)

## 2012-12-19 LAB — OSMOLALITY, URINE: Osmolality: 244 mOsm/kg

## 2012-12-19 LAB — CREATININE, SERUM
Creatinine: 1.7 mg/dL — ABNORMAL HIGH (ref 0.60–1.30)
EGFR (African American): 42 — ABNORMAL LOW
EGFR (Non-African Amer.): 36 — ABNORMAL LOW

## 2012-12-19 LAB — SODIUM: Sodium: 127 mmol/L — ABNORMAL LOW (ref 136–145)

## 2012-12-19 LAB — SODIUM, URINE, RANDOM: Sodium, Urine Random: 21 mmol/L (ref 20–110)

## 2012-12-20 LAB — BASIC METABOLIC PANEL
BUN: 37 mg/dL — ABNORMAL HIGH (ref 7–18)
Chloride: 97 mmol/L — ABNORMAL LOW (ref 98–107)
Co2: 25 mmol/L (ref 21–32)
Creatinine: 1.74 mg/dL — ABNORMAL HIGH (ref 0.60–1.30)
Glucose: 98 mg/dL (ref 65–99)
Osmolality: 268 (ref 275–301)
Sodium: 129 mmol/L — ABNORMAL LOW (ref 136–145)

## 2012-12-20 LAB — CBC WITH DIFFERENTIAL/PLATELET
Basophil #: 0 10*3/uL (ref 0.0–0.1)
Eosinophil #: 0.1 10*3/uL (ref 0.0–0.7)
HCT: 24.5 % — ABNORMAL LOW (ref 40.0–52.0)
Lymphocyte #: 0.6 10*3/uL — ABNORMAL LOW (ref 1.0–3.6)
Lymphocyte %: 8.9 %
Monocyte #: 0.5 x10 3/mm (ref 0.2–1.0)
Platelet: 151 10*3/uL (ref 150–440)

## 2012-12-20 LAB — SODIUM, URINE, RANDOM: Sodium, Urine Random: 31 mmol/L (ref 20–110)

## 2012-12-20 LAB — CHLORIDE, URINE, RANDOM: Chloride, Urine Random: 37 mmol/L — ABNORMAL LOW (ref 55–125)

## 2012-12-21 LAB — BASIC METABOLIC PANEL
Anion Gap: 7 (ref 7–16)
Calcium, Total: 8.8 mg/dL (ref 8.5–10.1)
Chloride: 98 mmol/L (ref 98–107)
Creatinine: 1.67 mg/dL — ABNORMAL HIGH (ref 0.60–1.30)
EGFR (African American): 43 — ABNORMAL LOW
EGFR (Non-African Amer.): 37 — ABNORMAL LOW

## 2012-12-21 LAB — CBC WITH DIFFERENTIAL/PLATELET
Basophil #: 0.1 10*3/uL (ref 0.0–0.1)
Eosinophil %: 2.9 %
HGB: 9.3 g/dL — ABNORMAL LOW (ref 13.0–18.0)
Lymphocyte #: 0.8 10*3/uL — ABNORMAL LOW (ref 1.0–3.6)
Lymphocyte %: 11.6 %
MCHC: 34.5 g/dL (ref 32.0–36.0)
MCV: 89 fL (ref 80–100)
Monocyte #: 0.5 x10 3/mm (ref 0.2–1.0)
RBC: 3.02 10*6/uL — ABNORMAL LOW (ref 4.40–5.90)
RDW: 13.9 % (ref 11.5–14.5)

## 2012-12-22 ENCOUNTER — Encounter: Payer: Self-pay | Admitting: Internal Medicine

## 2012-12-28 LAB — BASIC METABOLIC PANEL
Anion Gap: 3 — ABNORMAL LOW (ref 7–16)
BUN: 34 mg/dL — ABNORMAL HIGH (ref 7–18)
Calcium, Total: 9.2 mg/dL (ref 8.5–10.1)
Chloride: 104 mmol/L (ref 98–107)
Co2: 30 mmol/L (ref 21–32)
Creatinine: 1.42 mg/dL — ABNORMAL HIGH (ref 0.60–1.30)
Glucose: 82 mg/dL (ref 65–99)
Potassium: 4 mmol/L (ref 3.5–5.1)
Sodium: 137 mmol/L (ref 136–145)

## 2013-01-10 ENCOUNTER — Encounter: Payer: Self-pay | Admitting: Internal Medicine

## 2013-03-07 ENCOUNTER — Ambulatory Visit: Payer: Medicare Other | Admitting: Cardiovascular Disease

## 2013-05-02 ENCOUNTER — Ambulatory Visit (INDEPENDENT_AMBULATORY_CARE_PROVIDER_SITE_OTHER): Payer: Medicare Other | Admitting: Cardiovascular Disease

## 2013-05-02 ENCOUNTER — Encounter: Payer: Self-pay | Admitting: Cardiovascular Disease

## 2013-05-02 VITALS — BP 169/85 | HR 55 | Ht 68.5 in | Wt 133.5 lb

## 2013-05-02 DIAGNOSIS — I10 Essential (primary) hypertension: Secondary | ICD-10-CM

## 2013-05-02 DIAGNOSIS — I359 Nonrheumatic aortic valve disorder, unspecified: Secondary | ICD-10-CM

## 2013-05-02 DIAGNOSIS — I350 Nonrheumatic aortic (valve) stenosis: Secondary | ICD-10-CM

## 2013-05-02 DIAGNOSIS — R011 Cardiac murmur, unspecified: Secondary | ICD-10-CM

## 2013-05-02 DIAGNOSIS — R609 Edema, unspecified: Secondary | ICD-10-CM

## 2013-05-02 DIAGNOSIS — R0989 Other specified symptoms and signs involving the circulatory and respiratory systems: Secondary | ICD-10-CM

## 2013-05-02 DIAGNOSIS — R600 Localized edema: Secondary | ICD-10-CM

## 2013-05-02 NOTE — Progress Notes (Signed)
HPI  [name] is a 79 year old gentleman with history of hypertension, chronic renal insufficiency, lower stone edema, leg edema who presents for a follow up visit. He was seen by Dr. Mariah Milling for leg edema and a murmur. He requested to follow up with me as his wife is one of my patients. They are both here together.   He underwent an echocardiogram in July which showed normal LVSF, grade 1 diastolic dysfunction, mild aortic stenosis, moderate tricuspid regurgitation and moderate pulmonary hypertension.  Carotid doppler also in July was done due to carotid bruit. This showed minimal disease bilaterally.   He has been taking Lasix 20 mg once daily with improvement.  He was hospitalized in August for hip fracture and underwent surgical repair.  He has been doing reasonably well. BP is controlled at home.    No Known Allergies   Current Outpatient Prescriptions on File Prior to Visit  Medication Sig Dispense Refill  . furosemide (LASIX) 20 MG tablet Take 20 mg by mouth.       No current facility-administered medications on file prior to visit.     Past Medical History  Diagnosis Date  . Basal cell cancer 2012    Dr. Gwen Pounds s/p rescection right ear  . Hypertension   . Anxiety   . Gout   . Heart murmur   . Stroke      Past Surgical History  Procedure Laterality Date  . Hernia repair    . Total hip arthroplasty Right      Family History  Problem Relation Age of Onset  . Family history unknown: Yes     History   Social History  . Marital Status: Married    Spouse Name: N/A    Number of Children: N/A  . Years of Education: N/A   Occupational History  . Not on file.   Social History Main Topics  . Smoking status: Never Smoker   . Smokeless tobacco: Never Used  . Alcohol Use: No  . Drug Use: No  . Sexual Activity: Not on file   Other Topics Concern  . Not on file   Social History Narrative   Former Char[employer] fundraiser. Lives with wife in [place].      PHYSICAL EXAM   BP 169/85  Pulse 55  Ht 5' 8.5" (1.74 m)  Wt 133 lb 8 oz (60.555 kg)  BMI 20.00 kg/m2 Constitutional: He is oriented to person, place, and time. He appears well-developed and well-nourished. No distress.  HENT: No nasal discharge.  Head: Normocephalic and atraumatic.  Eyes: Pupils are equal and round.  No discharge. Neck: Normal range of motion. Neck supple. No JVD present. No thyromegaly present.  Cardiovascular: Normal rate, regular rhythm, normal heart sounds. Exam reveals no gallop and no friction rub. 2/6 SEM early peaking at aortic area.  Pulmonary/Chest: Effort normal and breath sounds normal. No stridor. No respiratory distress. He has no wheezes. He has no rales. He exhibits no tenderness.  Abdominal: Soft. Bowel sounds are normal. He exhibits no distension. There is no tenderness. There is no rebound and no guarding.  Musculoskeletal: Normal range of motion. He exhibits trace edema and no tenderness.  Neurological: He is alert and oriented to person, place, and time. Coordination normal.  Skin: Skin is warm and dry. No rash noted. He is not diaphoretic. No erythema. No pallor.  Psychiatric: He has a normal mood and affect. His behavior is normal. Judgment and thought content normal.  EKG: Sinus bradycardia   ASSESSMENT AND PLAN

## 2013-05-02 NOTE — Patient Instructions (Signed)
Continue same medications.   Your physician wants you to follow-up in: 12 months.  You will receive a reminder letter in the mail two months in advance. If you don't receive a letter, please call our office to schedule the follow-up appointment.  

## 2013-05-10 DIAGNOSIS — I350 Nonrheumatic aortic (valve) stenosis: Secondary | ICD-10-CM | POA: Insufficient documentation

## 2013-05-10 NOTE — Assessment & Plan Note (Signed)
Doppler showed minimal disease bilaterally.

## 2013-05-10 NOTE — Assessment & Plan Note (Signed)
BP is elevated but controlled at home.

## 2013-05-10 NOTE — Assessment & Plan Note (Signed)
Likely multifactorial due to chronic venous insufficiency, chronic diastolic heart failure with pulmonary hypertension and side effect of Amlodipine.  Continue support hose and small dose Lasix.

## 2013-05-10 NOTE — Assessment & Plan Note (Signed)
Mild by physical exam and echo. Continue to monitor and consider a repeat echo in 2-3 years.

## 2013-06-03 ENCOUNTER — Other Ambulatory Visit: Payer: Self-pay | Admitting: *Deleted

## 2013-06-03 MED ORDER — AMLODIPINE BESYLATE 5 MG PO TABS: 5.0000 mg | ORAL_TABLET | Freq: Every day | ORAL | Status: AC

## 2013-06-03 NOTE — Telephone Encounter (Signed)
90 day supply for one year

## 2013-07-14 DIAGNOSIS — I359 Nonrheumatic aortic valve disorder, unspecified: Secondary | ICD-10-CM

## 2013-07-14 LAB — URINALYSIS, COMPLETE
BACTERIA: NONE SEEN
Bilirubin,UR: NEGATIVE
Blood: NEGATIVE
Glucose,UR: NEGATIVE mg/dL (ref 0–75)
KETONE: NEGATIVE
LEUKOCYTE ESTERASE: NEGATIVE
Nitrite: NEGATIVE
PH: 6 (ref 4.5–8.0)
PROTEIN: NEGATIVE
RBC,UR: <1 /HPF (ref 0–5)
SPECIFIC GRAVITY: 1.009 (ref 1.003–1.030)
Squamous Epithelial: NONE SEEN
WBC UR: NONE SEEN /HPF (ref 0–5)

## 2013-07-14 LAB — COMPREHENSIVE METABOLIC PANEL
ANION GAP: 3 — AB (ref 7–16)
Albumin: 3.8 g/dL (ref 3.4–5.0)
Alkaline Phosphatase: 260 U/L — ABNORMAL HIGH
BUN: 37 mg/dL — ABNORMAL HIGH (ref 7–18)
Bilirubin,Total: 0.6 mg/dL (ref 0.2–1.0)
CALCIUM: 9.1 mg/dL (ref 8.5–10.1)
CO2: 29 mmol/L (ref 21–32)
Chloride: 103 mmol/L (ref 98–107)
Creatinine: 1.69 mg/dL — ABNORMAL HIGH (ref 0.60–1.30)
EGFR (African American): 43 — ABNORMAL LOW
EGFR (Non-African Amer.): 37 — ABNORMAL LOW
GLUCOSE: 65 mg/dL (ref 65–99)
OSMOLALITY: 277 (ref 275–301)
Potassium: 3.9 mmol/L (ref 3.5–5.1)
SGOT(AST): 23 U/L (ref 15–37)
SGPT (ALT): 14 U/L (ref 12–78)
Sodium: 135 mmol/L — ABNORMAL LOW (ref 136–145)
TOTAL PROTEIN: 7.9 g/dL (ref 6.4–8.2)

## 2013-07-14 LAB — TROPONIN I

## 2013-07-14 LAB — CBC
HCT: 40 % (ref 40.0–52.0)
HGB: 13.3 g/dL (ref 13.0–18.0)
MCH: 30 pg (ref 26.0–34.0)
MCHC: 33.1 g/dL (ref 32.0–36.0)
MCV: 91 fL (ref 80–100)
PLATELETS: 250 10*3/uL (ref 150–440)
RBC: 4.42 10*6/uL (ref 4.40–5.90)
RDW: 14.5 % (ref 11.5–14.5)
WBC: 6.4 10*3/uL (ref 3.8–10.6)

## 2013-07-15 ENCOUNTER — Inpatient Hospital Stay: Payer: Self-pay | Admitting: Internal Medicine

## 2013-07-15 LAB — CBC WITH DIFFERENTIAL/PLATELET
Basophil #: 0.1 10*3/uL (ref 0.0–0.1)
Basophil %: 1.1 %
EOS PCT: 5.4 %
Eosinophil #: 0.3 10*3/uL (ref 0.0–0.7)
HCT: 40 % (ref 40.0–52.0)
HGB: 12.8 g/dL — ABNORMAL LOW (ref 13.0–18.0)
LYMPHS ABS: 1.1 10*3/uL (ref 1.0–3.6)
Lymphocyte %: 19.2 %
MCH: 28.8 pg (ref 26.0–34.0)
MCHC: 32 g/dL (ref 32.0–36.0)
MCV: 90 fL (ref 80–100)
Monocyte #: 0.5 x10 3/mm (ref 0.2–1.0)
Monocyte %: 8.6 %
NEUTROS ABS: 3.7 10*3/uL (ref 1.4–6.5)
Neutrophil %: 65.7 %
Platelet: 235 10*3/uL (ref 150–440)
RBC: 4.43 10*6/uL (ref 4.40–5.90)
RDW: 14.2 % (ref 11.5–14.5)
WBC: 5.7 10*3/uL (ref 3.8–10.6)

## 2013-07-15 LAB — BASIC METABOLIC PANEL
Anion Gap: 4 — ABNORMAL LOW (ref 7–16)
BUN: 31 mg/dL — ABNORMAL HIGH (ref 7–18)
Calcium, Total: 8.7 mg/dL (ref 8.5–10.1)
Chloride: 106 mmol/L (ref 98–107)
Co2: 28 mmol/L (ref 21–32)
Creatinine: 1.66 mg/dL — ABNORMAL HIGH (ref 0.60–1.30)
EGFR (African American): 44 — ABNORMAL LOW
GFR CALC NON AF AMER: 38 — AB
Glucose: 79 mg/dL (ref 65–99)
OSMOLALITY: 281 (ref 275–301)
POTASSIUM: 4 mmol/L (ref 3.5–5.1)
SODIUM: 138 mmol/L (ref 136–145)

## 2013-07-15 LAB — LIPID PANEL
CHOLESTEROL: 140 mg/dL (ref 0–200)
HDL Cholesterol: 34 mg/dL — ABNORMAL LOW (ref 40–60)
Ldl Cholesterol, Calc: 92 mg/dL (ref 0–100)
Triglycerides: 72 mg/dL (ref 0–200)
VLDL CHOLESTEROL, CALC: 14 mg/dL (ref 5–40)

## 2013-07-15 LAB — T4, FREE: FREE THYROXINE: 0.98 ng/dL (ref 0.76–1.46)

## 2013-07-15 LAB — HEMOGLOBIN A1C: HEMOGLOBIN A1C: 5.7 % (ref 4.2–6.3)

## 2013-07-15 LAB — MAGNESIUM: MAGNESIUM: 2 mg/dL

## 2013-07-15 LAB — TSH: THYROID STIMULATING HORM: 5.19 u[IU]/mL — AB

## 2013-07-18 LAB — BASIC METABOLIC PANEL
Anion Gap: 5 — ABNORMAL LOW (ref 7–16)
BUN: 53 mg/dL — AB (ref 7–18)
CREATININE: 1.83 mg/dL — AB (ref 0.60–1.30)
Calcium, Total: 9 mg/dL (ref 8.5–10.1)
Chloride: 107 mmol/L (ref 98–107)
Co2: 25 mmol/L (ref 21–32)
EGFR (Non-African Amer.): 33 — ABNORMAL LOW
GFR CALC AF AMER: 39 — AB
Glucose: 89 mg/dL (ref 65–99)
OSMOLALITY: 288 (ref 275–301)
POTASSIUM: 4.3 mmol/L (ref 3.5–5.1)
Sodium: 137 mmol/L (ref 136–145)

## 2013-07-26 DIAGNOSIS — N183 Chronic kidney disease, stage 3 unspecified: Secondary | ICD-10-CM

## 2013-07-26 DIAGNOSIS — I5032 Chronic diastolic (congestive) heart failure: Secondary | ICD-10-CM

## 2013-07-26 DIAGNOSIS — I2510 Atherosclerotic heart disease of native coronary artery without angina pectoris: Secondary | ICD-10-CM

## 2013-07-26 DIAGNOSIS — I69959 Hemiplegia and hemiparesis following unspecified cerebrovascular disease affecting unspecified side: Secondary | ICD-10-CM

## 2013-08-09 DIAGNOSIS — R070 Pain in throat: Secondary | ICD-10-CM

## 2013-08-09 DIAGNOSIS — G479 Sleep disorder, unspecified: Secondary | ICD-10-CM

## 2013-08-17 ENCOUNTER — Encounter: Payer: Self-pay | Admitting: Cardiovascular Disease

## 2013-08-18 DIAGNOSIS — N183 Chronic kidney disease, stage 3 unspecified: Secondary | ICD-10-CM

## 2013-08-18 DIAGNOSIS — I69959 Hemiplegia and hemiparesis following unspecified cerebrovascular disease affecting unspecified side: Secondary | ICD-10-CM

## 2013-08-18 DIAGNOSIS — I5032 Chronic diastolic (congestive) heart failure: Secondary | ICD-10-CM

## 2013-08-18 DIAGNOSIS — I2510 Atherosclerotic heart disease of native coronary artery without angina pectoris: Secondary | ICD-10-CM

## 2013-08-22 DIAGNOSIS — L98499 Non-pressure chronic ulcer of skin of other sites with unspecified severity: Secondary | ICD-10-CM

## 2013-09-09 DIAGNOSIS — G4709 Other insomnia: Secondary | ICD-10-CM

## 2013-09-15 DIAGNOSIS — N183 Chronic kidney disease, stage 3 unspecified: Secondary | ICD-10-CM

## 2013-09-15 DIAGNOSIS — G479 Sleep disorder, unspecified: Secondary | ICD-10-CM

## 2013-09-15 DIAGNOSIS — I2510 Atherosclerotic heart disease of native coronary artery without angina pectoris: Secondary | ICD-10-CM

## 2013-09-15 DIAGNOSIS — I69959 Hemiplegia and hemiparesis following unspecified cerebrovascular disease affecting unspecified side: Secondary | ICD-10-CM

## 2013-09-15 DIAGNOSIS — I5032 Chronic diastolic (congestive) heart failure: Secondary | ICD-10-CM

## 2013-09-16 ENCOUNTER — Encounter: Payer: Self-pay | Admitting: Internal Medicine

## 2013-09-22 LAB — BASIC METABOLIC PANEL
ANION GAP: 7 (ref 7–16)
BUN: 47 mg/dL — ABNORMAL HIGH (ref 7–18)
CO2: 28 mmol/L (ref 21–32)
Calcium, Total: 9.6 mg/dL (ref 8.5–10.1)
Chloride: 101 mmol/L (ref 98–107)
Creatinine: 1.8 mg/dL — ABNORMAL HIGH (ref 0.60–1.30)
EGFR (African American): 39 — ABNORMAL LOW
GFR CALC NON AF AMER: 34 — AB
GLUCOSE: 126 mg/dL — AB (ref 65–99)
OSMOLALITY: 286 (ref 275–301)
Potassium: 4.5 mmol/L (ref 3.5–5.1)
SODIUM: 136 mmol/L (ref 136–145)

## 2013-09-28 ENCOUNTER — Inpatient Hospital Stay: Payer: Self-pay | Admitting: Surgery

## 2013-09-28 LAB — BASIC METABOLIC PANEL
Anion Gap: 5 — ABNORMAL LOW (ref 7–16)
BUN: 57 mg/dL — AB (ref 7–18)
CALCIUM: 9.4 mg/dL (ref 8.5–10.1)
Chloride: 99 mmol/L (ref 98–107)
Co2: 29 mmol/L (ref 21–32)
Creatinine: 2.24 mg/dL — ABNORMAL HIGH (ref 0.60–1.30)
EGFR (African American): 30 — ABNORMAL LOW
EGFR (Non-African Amer.): 26 — ABNORMAL LOW
Glucose: 101 mg/dL — ABNORMAL HIGH (ref 65–99)
Osmolality: 282 (ref 275–301)
POTASSIUM: 5.5 mmol/L — AB (ref 3.5–5.1)
Sodium: 133 mmol/L — ABNORMAL LOW (ref 136–145)

## 2013-09-28 LAB — CBC WITH DIFFERENTIAL/PLATELET
Basophil #: 0.1 10*3/uL (ref 0.0–0.1)
Basophil #: 0 10*3/uL (ref 0.0–0.1)
Basophil %: 1.3 %
Basophil %: 0.6 %
EOS ABS: 0.4 10*3/uL (ref 0.0–0.7)
EOS PCT: 5.2 %
Eosinophil #: 0.3 10*3/uL (ref 0.0–0.7)
Eosinophil %: 5.1 %
HCT: 36.2 % — AB (ref 40.0–52.0)
HCT: 34.3 % — AB (ref 40.0–52.0)
HGB: 12.1 g/dL — AB (ref 13.0–18.0)
HGB: 11.2 g/dL — ABNORMAL LOW (ref 13.0–18.0)
LYMPHS PCT: 13 %
Lymphocyte #: 0.9 10*3/uL — ABNORMAL LOW (ref 1.0–3.6)
Lymphocyte #: 0.8 10*3/uL — ABNORMAL LOW (ref 1.0–3.6)
Lymphocyte %: 11.1 %
MCH: 30.2 pg (ref 26.0–34.0)
MCH: 29.5 pg (ref 26.0–34.0)
MCHC: 33.5 g/dL (ref 32.0–36.0)
MCHC: 32.8 g/dL (ref 32.0–36.0)
MCV: 90 fL (ref 80–100)
MCV: 90 fL (ref 80–100)
MONO ABS: 0.6 x10 3/mm (ref 0.2–1.0)
MONO ABS: 0.6 x10 3/mm (ref 0.2–1.0)
MONOS PCT: 8.5 %
Monocyte %: 8.5 %
NEUTROS ABS: 4.8 10*3/uL (ref 1.4–6.5)
NEUTROS ABS: 5.5 10*3/uL (ref 1.4–6.5)
NEUTROS PCT: 72.1 %
Neutrophil %: 74.6 %
PLATELETS: 256 10*3/uL (ref 150–440)
PLATELETS: 244 10*3/uL (ref 150–440)
RBC: 4.01 10*6/uL — AB (ref 4.40–5.90)
RBC: 3.81 10*6/uL — ABNORMAL LOW (ref 4.40–5.90)
RDW: 13.8 % (ref 11.5–14.5)
RDW: 13.9 % (ref 11.5–14.5)
WBC: 6.6 10*3/uL (ref 3.8–10.6)
WBC: 7.4 10*3/uL (ref 3.8–10.6)

## 2013-09-28 LAB — COMPREHENSIVE METABOLIC PANEL
AST: 13 U/L — AB (ref 15–37)
Albumin: 3.4 g/dL (ref 3.4–5.0)
Alkaline Phosphatase: 126 U/L — ABNORMAL HIGH
Anion Gap: 8 (ref 7–16)
BILIRUBIN TOTAL: 0.3 mg/dL (ref 0.2–1.0)
BUN: 61 mg/dL — AB (ref 7–18)
CALCIUM: 9.2 mg/dL (ref 8.5–10.1)
CO2: 27 mmol/L (ref 21–32)
Chloride: 99 mmol/L (ref 98–107)
Creatinine: 2.1 mg/dL — ABNORMAL HIGH (ref 0.60–1.30)
EGFR (Non-African Amer.): 28 — ABNORMAL LOW
GFR CALC AF AMER: 33 — AB
Glucose: 101 mg/dL — ABNORMAL HIGH (ref 65–99)
Osmolality: 286 (ref 275–301)
POTASSIUM: 5.8 mmol/L — AB (ref 3.5–5.1)
SGPT (ALT): 16 U/L (ref 12–78)
Sodium: 134 mmol/L — ABNORMAL LOW (ref 136–145)
Total Protein: 7.1 g/dL (ref 6.4–8.2)

## 2013-09-28 LAB — APTT: ACTIVATED PTT: 31.8 s (ref 23.6–35.9)

## 2013-09-28 LAB — PROTIME-INR
INR: 1
Prothrombin Time: 13.2 secs (ref 11.5–14.7)

## 2013-09-28 LAB — MAGNESIUM: Magnesium: 2.7 mg/dL — ABNORMAL HIGH

## 2013-09-29 LAB — POTASSIUM: POTASSIUM: 4.5 mmol/L (ref 3.5–5.1)

## 2013-10-06 LAB — BASIC METABOLIC PANEL
ANION GAP: 9 (ref 7–16)
BUN: 53 mg/dL — AB (ref 7–18)
Calcium, Total: 9 mg/dL (ref 8.5–10.1)
Chloride: 104 mmol/L (ref 98–107)
Co2: 27 mmol/L (ref 21–32)
Creatinine: 2.19 mg/dL — ABNORMAL HIGH (ref 0.60–1.30)
EGFR (African American): 31 — ABNORMAL LOW
EGFR (Non-African Amer.): 27 — ABNORMAL LOW
Glucose: 88 mg/dL (ref 65–99)
OSMOLALITY: 293 (ref 275–301)
Potassium: 4.7 mmol/L (ref 3.5–5.1)
Sodium: 140 mmol/L (ref 136–145)

## 2013-10-12 ENCOUNTER — Ambulatory Visit: Payer: Self-pay | Admitting: Gerontology

## 2013-10-12 ENCOUNTER — Encounter: Payer: Self-pay | Admitting: Internal Medicine

## 2013-10-12 LAB — CBC WITH DIFFERENTIAL/PLATELET
BASOS ABS: 0.1 10*3/uL (ref 0.0–0.1)
BASOS PCT: 0.7 %
EOS ABS: 0.3 10*3/uL (ref 0.0–0.7)
EOS PCT: 3.7 %
HCT: 32.6 % — ABNORMAL LOW (ref 40.0–52.0)
HGB: 10.6 g/dL — AB (ref 13.0–18.0)
LYMPHS ABS: 1 10*3/uL (ref 1.0–3.6)
Lymphocyte %: 10.9 %
MCH: 30.2 pg (ref 26.0–34.0)
MCHC: 32.4 g/dL (ref 32.0–36.0)
MCV: 93 fL (ref 80–100)
MONOS PCT: 4.4 %
Monocyte #: 0.4 x10 3/mm (ref 0.2–1.0)
NEUTROS ABS: 7.1 10*3/uL — AB (ref 1.4–6.5)
Neutrophil %: 80.3 %
PLATELETS: 360 10*3/uL (ref 150–440)
RBC: 3.51 10*6/uL — ABNORMAL LOW (ref 4.40–5.90)
RDW: 14.1 % (ref 11.5–14.5)
WBC: 8.8 10*3/uL (ref 3.8–10.6)

## 2013-10-12 LAB — COMPREHENSIVE METABOLIC PANEL
ALBUMIN: 2.4 g/dL — AB (ref 3.4–5.0)
Alkaline Phosphatase: 114 U/L
Anion Gap: 7 (ref 7–16)
BUN: 51 mg/dL — AB (ref 7–18)
Bilirubin,Total: 0.3 mg/dL (ref 0.2–1.0)
CALCIUM: 9 mg/dL (ref 8.5–10.1)
CREATININE: 2.58 mg/dL — AB (ref 0.60–1.30)
Chloride: 108 mmol/L — ABNORMAL HIGH (ref 98–107)
Co2: 25 mmol/L (ref 21–32)
EGFR (African American): 26 — ABNORMAL LOW
GFR CALC NON AF AMER: 22 — AB
Glucose: 80 mg/dL (ref 65–99)
Osmolality: 292 (ref 275–301)
Potassium: 4.6 mmol/L (ref 3.5–5.1)
SGOT(AST): 25 U/L (ref 15–37)
SGPT (ALT): 21 U/L (ref 12–78)
Sodium: 140 mmol/L (ref 136–145)
Total Protein: 6.8 g/dL (ref 6.4–8.2)

## 2013-10-12 LAB — MAGNESIUM: Magnesium: 2.3 mg/dL

## 2013-10-28 ENCOUNTER — Ambulatory Visit: Payer: Self-pay | Admitting: Gerontology

## 2013-11-09 ENCOUNTER — Encounter: Payer: Self-pay | Admitting: Internal Medicine

## 2013-12-10 ENCOUNTER — Encounter: Payer: Self-pay | Admitting: Internal Medicine

## 2013-12-13 ENCOUNTER — Ambulatory Visit: Payer: Medicare Other | Admitting: Internal Medicine

## 2014-01-10 ENCOUNTER — Inpatient Hospital Stay: Payer: Self-pay | Admitting: Internal Medicine

## 2014-01-10 ENCOUNTER — Ambulatory Visit: Payer: Self-pay | Admitting: Internal Medicine

## 2014-01-10 ENCOUNTER — Encounter: Payer: Self-pay | Admitting: Internal Medicine

## 2014-01-10 LAB — COMPREHENSIVE METABOLIC PANEL
ALK PHOS: 127 U/L — AB
AST: 20 U/L (ref 15–37)
Albumin: 2.5 g/dL — ABNORMAL LOW (ref 3.4–5.0)
Anion Gap: 8 (ref 7–16)
BUN: 56 mg/dL — ABNORMAL HIGH (ref 7–18)
Bilirubin,Total: 0.2 mg/dL (ref 0.2–1.0)
CALCIUM: 8.6 mg/dL (ref 8.5–10.1)
CO2: 25 mmol/L (ref 21–32)
Chloride: 104 mmol/L (ref 98–107)
Creatinine: 1.36 mg/dL — ABNORMAL HIGH (ref 0.60–1.30)
EGFR (Non-African Amer.): 47 — ABNORMAL LOW
GFR CALC AF AMER: 55 — AB
GLUCOSE: 107 mg/dL — AB (ref 65–99)
Osmolality: 290 (ref 275–301)
Potassium: 5.5 mmol/L — ABNORMAL HIGH (ref 3.5–5.1)
SGPT (ALT): 11 U/L — ABNORMAL LOW
SODIUM: 137 mmol/L (ref 136–145)
Total Protein: 6.1 g/dL — ABNORMAL LOW (ref 6.4–8.2)

## 2014-01-10 LAB — CBC WITH DIFFERENTIAL/PLATELET
BASOS PCT: 0.5 %
Basophil #: 0.1 10*3/uL (ref 0.0–0.1)
Basophil #: 0.1 10*3/uL (ref 0.0–0.1)
Basophil %: 0.7 %
EOS ABS: 0.1 10*3/uL (ref 0.0–0.7)
EOS ABS: 0.1 10*3/uL (ref 0.0–0.7)
EOS PCT: 1.2 %
EOS PCT: 0.7 %
HCT: 25.7 % — ABNORMAL LOW (ref 40.0–52.0)
HCT: 22.6 % — ABNORMAL LOW (ref 40.0–52.0)
HGB: 8.4 g/dL — ABNORMAL LOW (ref 13.0–18.0)
HGB: 7.4 g/dL — ABNORMAL LOW (ref 13.0–18.0)
Lymphocyte #: 0.9 10*3/uL — ABNORMAL LOW (ref 1.0–3.6)
Lymphocyte #: 1.4 10*3/uL (ref 1.0–3.6)
Lymphocyte %: 8.9 %
Lymphocyte %: 16.3 %
MCH: 29.8 pg (ref 26.0–34.0)
MCH: 29.4 pg (ref 26.0–34.0)
MCHC: 32.6 g/dL (ref 32.0–36.0)
MCHC: 32.7 g/dL (ref 32.0–36.0)
MCV: 91 fL (ref 80–100)
MCV: 90 fL (ref 80–100)
MONOS PCT: 4.4 %
Monocyte #: 0.5 x10 3/mm (ref 0.2–1.0)
Monocyte #: 0.5 x10 3/mm (ref 0.2–1.0)
Monocyte %: 6.3 %
NEUTROS ABS: 6.3 10*3/uL (ref 1.4–6.5)
NEUTROS PCT: 76 %
Neutrophil #: 9 10*3/uL — ABNORMAL HIGH (ref 1.4–6.5)
Neutrophil %: 85 %
Platelet: 287 10*3/uL (ref 150–440)
Platelet: 258 10*3/uL (ref 150–440)
RBC: 2.82 10*6/uL — AB (ref 4.40–5.90)
RBC: 2.52 10*6/uL — ABNORMAL LOW (ref 4.40–5.90)
RDW: 13.3 % (ref 11.5–14.5)
RDW: 13.4 % (ref 11.5–14.5)
WBC: 10.6 10*3/uL (ref 3.8–10.6)
WBC: 8.3 10*3/uL (ref 3.8–10.6)

## 2014-01-10 LAB — URINALYSIS, COMPLETE
BILIRUBIN, UR: NEGATIVE
BLOOD: NEGATIVE
Bacteria: NONE SEEN
Glucose,UR: NEGATIVE mg/dL (ref 0–75)
Hyaline Cast: 3
Ketone: NEGATIVE
Leukocyte Esterase: NEGATIVE
NITRITE: NEGATIVE
PROTEIN: NEGATIVE
Ph: 5 (ref 4.5–8.0)
RBC,UR: NONE SEEN /HPF (ref 0–5)
SPECIFIC GRAVITY: 1.014 (ref 1.003–1.030)
Squamous Epithelial: NONE SEEN
WBC UR: 2 /HPF (ref 0–5)

## 2014-01-10 LAB — BASIC METABOLIC PANEL
Anion Gap: 8 (ref 7–16)
BUN: 62 mg/dL — ABNORMAL HIGH (ref 7–18)
CREATININE: 1.42 mg/dL — AB (ref 0.60–1.30)
Calcium, Total: 8.5 mg/dL (ref 8.5–10.1)
Chloride: 104 mmol/L (ref 98–107)
Co2: 26 mmol/L (ref 21–32)
GFR CALC AF AMER: 52 — AB
GFR CALC NON AF AMER: 45 — AB
GLUCOSE: 94 mg/dL (ref 65–99)
Osmolality: 293 (ref 275–301)
Potassium: 4.7 mmol/L (ref 3.5–5.1)
SODIUM: 138 mmol/L (ref 136–145)

## 2014-01-10 LAB — LIPASE, BLOOD: Lipase: 111 U/L (ref 73–393)

## 2014-01-10 LAB — APTT: Activated PTT: 32.6 secs (ref 23.6–35.9)

## 2014-01-10 LAB — PROTIME-INR
INR: 1.2
PROTHROMBIN TIME: 14.8 s — AB (ref 11.5–14.7)

## 2014-01-11 LAB — BASIC METABOLIC PANEL
ANION GAP: 6 — AB (ref 7–16)
BUN: 54 mg/dL — ABNORMAL HIGH (ref 7–18)
CALCIUM: 8.1 mg/dL — AB (ref 8.5–10.1)
CO2: 25 mmol/L (ref 21–32)
Chloride: 108 mmol/L — ABNORMAL HIGH (ref 98–107)
Creatinine: 1.46 mg/dL — ABNORMAL HIGH (ref 0.60–1.30)
EGFR (African American): 50 — ABNORMAL LOW
GFR CALC NON AF AMER: 44 — AB
Glucose: 81 mg/dL (ref 65–99)
OSMOLALITY: 291 (ref 275–301)
Potassium: 4.1 mmol/L (ref 3.5–5.1)
Sodium: 139 mmol/L (ref 136–145)

## 2014-01-11 LAB — CBC WITH DIFFERENTIAL/PLATELET
Basophil #: 0 10*3/uL (ref 0.0–0.1)
Basophil %: 0.7 %
Eosinophil #: 0.2 10*3/uL (ref 0.0–0.7)
Eosinophil %: 3.6 %
HCT: 19.1 % — AB (ref 40.0–52.0)
HGB: 6.2 g/dL — ABNORMAL LOW (ref 13.0–18.0)
LYMPHS PCT: 22.9 %
Lymphocyte #: 1.3 10*3/uL (ref 1.0–3.6)
MCH: 29.4 pg (ref 26.0–34.0)
MCHC: 32.4 g/dL (ref 32.0–36.0)
MCV: 91 fL (ref 80–100)
Monocyte #: 0.5 x10 3/mm (ref 0.2–1.0)
Monocyte %: 8 %
NEUTROS PCT: 64.8 %
Neutrophil #: 3.7 10*3/uL (ref 1.4–6.5)
PLATELETS: 205 10*3/uL (ref 150–440)
RBC: 2.11 10*6/uL — ABNORMAL LOW (ref 4.40–5.90)
RDW: 13.4 % (ref 11.5–14.5)
WBC: 5.7 10*3/uL (ref 3.8–10.6)

## 2014-01-11 LAB — HEMATOCRIT
HCT: 19.1 % — ABNORMAL LOW (ref 40.0–52.0)
HCT: 28 % — AB (ref 40.0–52.0)
HCT: 26.7 % — ABNORMAL LOW (ref 40.0–52.0)

## 2014-01-11 LAB — HEMOGLOBIN
HGB: 6.1 g/dL — ABNORMAL LOW (ref 13.0–18.0)
HGB: 9.5 g/dL — AB (ref 13.0–18.0)
HGB: 8.8 g/dL — ABNORMAL LOW (ref 13.0–18.0)

## 2014-01-12 LAB — BASIC METABOLIC PANEL
ANION GAP: 7 (ref 7–16)
BUN: 50 mg/dL — AB (ref 7–18)
CHLORIDE: 107 mmol/L (ref 98–107)
CREATININE: 1.46 mg/dL — AB (ref 0.60–1.30)
Calcium, Total: 9.1 mg/dL (ref 8.5–10.1)
Co2: 25 mmol/L (ref 21–32)
EGFR (African American): 50 — ABNORMAL LOW
EGFR (Non-African Amer.): 44 — ABNORMAL LOW
GLUCOSE: 87 mg/dL (ref 65–99)
Osmolality: 290 (ref 275–301)
Potassium: 3.5 mmol/L (ref 3.5–5.1)
Sodium: 139 mmol/L (ref 136–145)

## 2014-01-12 LAB — CBC WITH DIFFERENTIAL/PLATELET
BASOS ABS: 0 10*3/uL (ref 0.0–0.1)
Basophil %: 0.4 %
Eosinophil #: 0.4 10*3/uL (ref 0.0–0.7)
Eosinophil %: 4.8 %
HCT: 26.4 % — ABNORMAL LOW (ref 40.0–52.0)
HGB: 8.8 g/dL — ABNORMAL LOW (ref 13.0–18.0)
LYMPHS ABS: 1 10*3/uL (ref 1.0–3.6)
Lymphocyte %: 11.7 %
MCH: 30 pg (ref 26.0–34.0)
MCHC: 33.4 g/dL (ref 32.0–36.0)
MCV: 90 fL (ref 80–100)
Monocyte #: 0.6 x10 3/mm (ref 0.2–1.0)
Monocyte %: 7 %
NEUTROS PCT: 76.1 %
Neutrophil #: 6.6 10*3/uL — ABNORMAL HIGH (ref 1.4–6.5)
PLATELETS: 199 10*3/uL (ref 150–440)
RBC: 2.94 10*6/uL — ABNORMAL LOW (ref 4.40–5.90)
RDW: 13.5 % (ref 11.5–14.5)
WBC: 8.6 10*3/uL (ref 3.8–10.6)

## 2014-01-12 LAB — OCCULT BLOOD X 1 CARD TO LAB, STOOL: OCCULT BLOOD, FECES: POSITIVE

## 2014-01-13 LAB — CBC WITH DIFFERENTIAL/PLATELET
BASOS PCT: 0.3 %
Basophil #: 0 10*3/uL (ref 0.0–0.1)
EOS ABS: 0.5 10*3/uL (ref 0.0–0.7)
Eosinophil %: 5.2 %
HCT: 26.2 % — ABNORMAL LOW (ref 40.0–52.0)
HGB: 8.7 g/dL — AB (ref 13.0–18.0)
LYMPHS PCT: 10.2 %
Lymphocyte #: 0.9 10*3/uL — ABNORMAL LOW (ref 1.0–3.6)
MCH: 29.6 pg (ref 26.0–34.0)
MCHC: 33 g/dL (ref 32.0–36.0)
MCV: 90 fL (ref 80–100)
MONO ABS: 0.5 x10 3/mm (ref 0.2–1.0)
MONOS PCT: 6 %
Neutrophil #: 6.8 10*3/uL — ABNORMAL HIGH (ref 1.4–6.5)
Neutrophil %: 78.3 %
Platelet: 199 10*3/uL (ref 150–440)
RBC: 2.93 10*6/uL — AB (ref 4.40–5.90)
RDW: 13.4 % (ref 11.5–14.5)
WBC: 8.7 10*3/uL (ref 3.8–10.6)

## 2014-01-13 LAB — BASIC METABOLIC PANEL
Anion Gap: 8 (ref 7–16)
BUN: 36 mg/dL — ABNORMAL HIGH (ref 7–18)
CO2: 26 mmol/L (ref 21–32)
Calcium, Total: 8.2 mg/dL — ABNORMAL LOW (ref 8.5–10.1)
Chloride: 107 mmol/L (ref 98–107)
Creatinine: 1.35 mg/dL — ABNORMAL HIGH (ref 0.60–1.30)
EGFR (Non-African Amer.): 48 — ABNORMAL LOW
GFR CALC AF AMER: 55 — AB
Glucose: 85 mg/dL (ref 65–99)
OSMOLALITY: 289 (ref 275–301)
Potassium: 3.5 mmol/L (ref 3.5–5.1)
SODIUM: 141 mmol/L (ref 136–145)

## 2014-02-09 ENCOUNTER — Encounter: Payer: Self-pay | Admitting: Internal Medicine

## 2014-02-09 ENCOUNTER — Ambulatory Visit: Payer: Self-pay | Admitting: Internal Medicine

## 2014-02-12 LAB — URINALYSIS, COMPLETE
Bilirubin,UR: NEGATIVE
Blood: NEGATIVE
Glucose,UR: NEGATIVE mg/dL (ref 0–75)
Ketone: NEGATIVE
Leukocyte Esterase: NEGATIVE
Nitrite: NEGATIVE
Ph: 6 (ref 4.5–8.0)
Protein: NEGATIVE
Specific Gravity: 1.012 (ref 1.003–1.030)
Squamous Epithelial: NONE SEEN
WBC UR: 1 /HPF (ref 0–5)

## 2014-02-16 LAB — URINE CULTURE

## 2014-03-12 ENCOUNTER — Encounter: Payer: Self-pay | Admitting: Internal Medicine

## 2014-04-11 ENCOUNTER — Encounter: Payer: Self-pay | Admitting: Internal Medicine

## 2014-05-12 ENCOUNTER — Encounter: Payer: Self-pay | Admitting: Internal Medicine

## 2014-06-12 ENCOUNTER — Encounter: Payer: Self-pay | Admitting: Internal Medicine

## 2014-07-11 ENCOUNTER — Encounter
Admit: 2014-07-11 | Disposition: A | Discharge status: Home / Self Care | Payer: Self-pay | Attending: Internal Medicine | Admitting: Internal Medicine

## 2014-08-11 ENCOUNTER — Encounter
Admit: 2014-08-11 | Disposition: A | Discharge status: Home / Self Care | Payer: Self-pay | Attending: Internal Medicine | Admitting: Internal Medicine

## 2014-09-01 NOTE — Discharge Summary (Signed)
PATIENT NAME:  Ricky Vance, Ricky Vance MR#:  [id number] DATE OF BIRTH:  1927/05/18  DATE OF ADMISSION:  12/15/2012 DATE OF DISCHARGE:  12/21/2012  DISCHARGE DIAGNOSES 1.  Fall, fracture right hip.  2.  Acute on chronic renal failure, recovered.  3.  Hyponatremia.  4.  Urinary retention, possible benign prostatic hypertrophy.  5.  Hypertension.   CONDITION ON DISCHARGE: Stable.   CODE STATUS: Full code.   DISCHARGE MEDICATIONS 1.  Amlodipine 5 mg oral tablet once a day.  2.  Oxycodone 5 mg oral capsule, take 1 to 2 capsules orally every 4 to 6 hours as needed for pain.  3.  Tramadol 50 mg, 1 to 2 tablets every 4 to 6 hours as needed for pain.  4.  Tamsulosin 0.4 mg once a day.  5.  Docusate sodium 50 mg/8.6 mg 2 times a day.  6.  Pantoprazole 40 mg orally 2 times a day.   ACTIVITY: As tolerated.    FOLLOWUP:  Advised to follow within 2 to 4 weeks in Ortho clinic.   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 He was usually walking 45 minutes a day without any short of breath or pain in his chest. Was cleaning some spider web and fell down, in the ER found having fracture in his right hip and admitted to medical service for medical issues. Surgery was done and after the surgery the patient developed some anemia, acute renal failure and his hyponatremia. His hospital course was 6 to 7 days because of these complication. Renal failure improved gradually with IV fluid and holding Lasix. He developed hyponatremia, sodium level went up to 127 and there was 1 time suspicion of having patient excessive water intake but the patient confirmed not taking it and nephrology consult was called in and they agreed to hold Lasix and continue monitoring.  His sodium level came up to 131 and we are discharging with further instructions to family about getting his BMP checked within next week to make sure of his sodium level and kidney function.    OTHER ISSUES IN THIS HOSPITAL COURSE 1.  As he was having Foley catheter due to surgery, later on after third day when he tried to remove the catheter, after catheter was removed he did not have urinary output for up to 8 to 10 hours while he had almost 400 mL residual volume so when the catheter was replaced. On further history from family, found that the patient had a history of prostate problem in the past but was not taking any medication regularly. He was started on Flomax again and after 2 days we were able to successfully discontinue urinary catheter and the patient appreciated that so we are discharging with Flomax orally.  2.  Other medical issues there was mild acute on chronic renal failure. Baseline creatinine appears to be 1.5. His creatinine went up to 1.8, 1.7 and then came down finally. We advised to follow within the next week his creatinine for kidney function.  3.  Hypertension. We continued home medication, amlodipine, but we held the Lasix due to renal failure and hyponatremia.   IMPORTANT LABORATORY RESULTS IN THE HOSPITAL: X-ray pelvis comminuted right intertrochanteric fracture without dislocation. Hemoglobin on admission was 14.3 and creatinine was 2.0. Creatinine came down to 1.6 and then gradually to 1.45 on 8th of August, and then went up again to 1.73 and on the day of discharge it came down  to 1.67. His hemoglobin also dropped down to 8.8 gradually and on the day of discharge it is 9.3.   TOTAL TIME SPENT ON THIS DISCHARGE: 45 minutes.  ____________________________ Ceasar Lund Ricky Jungling, MD vgv:cs D: 12/21/2012 14:26:34 ET T: 12/21/2012 15:00:11 ET JOB#: [id number]  cc: Ceasar Lund. Ricky Jungling, MD, <Dictator> Laurene Footman, MD Vaughan Basta MD ELECTRONICALLY SIGNED 01/11/2013 0:36

## 2014-09-01 NOTE — Op Note (Signed)
PATIENT NAME:  Ricky Vance, Ricky Vance MR#:  [id number] DATE OF BIRTH:  [birth year]  DATE OF PROCEDURE:  12/16/2012  PREOPERATIVE DIAGNOSIS: Right pertrochanteric femur fracture.   POSTOPERATIVE DIAGNOSIS: Right pertrochanteric femur fracture.   PROCEDURE PERFORMED: Open reduction and internal fixation of right pertrochanteric femur fracture.   SURGEON: Laurice Record. Holley Bouche., MD  ANESTHESIA: Spinal.   ESTIMATED BLOOD LOSS: 200 mL.   FLUIDS REPLACED: 1200 mL of crystalloid.   DRAINS: None.   IMPLANTS UTILIZED: Synthes 130 degree 11 mm x 420 mm trochanteric fixation nail, 95 mm helical blade, and a 5 mm x 50 mm locking screw.   INDICATIONS FOR SURGERY: The patient is an 81 year old male who lost his balance and fell, landing on his right hip and side. X-rays demonstrated a comminuted right pertrochanteric femur fracture. After discussion of the risks and benefits of surgical intervention, the patient expressed understanding of the risks and benefits and agreed with plans for surgical intervention.   PROCEDURE IN DETAIL: The patient was brought into the operating room, and after adequate spinal anesthesia was achieved, the patient was placed on the fracture table, and traction was applied to the right lower extremity. All bony prominences were well padded. Provisional reduction was performed and confirmed using C-arm. The patient's right hip and leg were cleaned and prepped with alcohol and DuraPrep and draped in the usual sterile fashion. A "timeout" was performed as per usual protocol. A lateral incision was made extending from the area of the tip of the trochanter proximally. Fascia was incised, and abductor musculature was split. The tip of the greater trochanter was palpated, and a distally threaded guidepin was inserted through the tip of the greater trochanter and into the intramedullary canal. Position was confirmed in both AP and lateral planes using C-arm. Step drill was used to enlarge the entry  site. A beaded long guidewire was then advanced down the medullary canal, and position was confirmed using C-arm. Measurements were obtained, and it was felt that a 420 mm nail was appropriate. The canal was reamed to a 12 mm diameter. A 130 degree 11 mm x 420 mm trochanteric fixation nail was then advanced over the guidewire. Good position was noted in multiple planes. Guidewire was removed. A second stab incision was made, and tissue protector was advanced through the outrigger device until making contact with the lateral cortex of the proximal femur. A distally threaded guidepin was inserted into the femoral neck and head. Position was confirmed in both AP and lateral planes. Measurements were obtained, and it was felt that a 95 mm helical blade was appropriate length. Lateral cortex was reamed, followed by advancement of a cannulated reamer. A 95 mm helical blade was then advanced over the guidepin and impacted into place. Good purchase was appreciated. Excellent position was noted in both AP and lateral planes using the C-arm. Locking sleeve was engaged proximally. Next, the C-arm was positioned so as to visualize the distal locking holes. A stab incision was made laterally, and soft tissue was split. Using C-arm, drill hole was placed through the oblong locking site. Measurements were obtained, and a 5 mm x 50 mm locking screw was inserted. Good position was noted in both AP and lateral planes. Outrigger device was removed. Good maintenance of reduction was appreciated. The wounds were irrigated with copious amounts of normal saline with antibiotic solution. Fascia was reapproximated using interrupted sutures of #1 Vicryl. The subcutaneous tissue was approximated in layers using first #0 Vicryl, followed by #2-0  Vicryl. Skin was closed with skin staples. Sterile dressing was applied.   The patient tolerated the procedure well. He was transported to the recovery room in stable condition.    ____________________________ Laurice Record. Holley Bouche., MD jph:OSi D: 12/16/2012 23:15:42 ET T: 12/17/2012 06:31:18 ET JOB#: [id number]  cc: Laurice Record. Holley Bouche., MD, <Dictator> Laurice Record Holley Bouche MD ELECTRONICALLY SIGNED 12/19/2012 17:26

## 2014-09-01 NOTE — H&P (Signed)
PATIENT NAME:  Ricky Vance, SMELTZ MR#:  [id number] DATE OF BIRTH:  [birth year]  DATE OF ADMISSION:  12/15/2012  PRIMARY CARE PHYSICIAN: Dr. Jacqualine Code  PRIMARY CARDIOLOGIST: Dr. Rockey Situ.  REFERRING PHYSICIAN: Dr. 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 The patient stated that he is usually healthy and recently picked up again on his walking and usually walks about 45 minutes a day without any shortness of breath or pains in the chest. He was cleaning some spider webs and fell, sustaining a displaced hip fracture. Dr. Marry Guan has been notified. Hospitalist services were contacted for further evaluation and management and admission.   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 Rockey Situ.   PAST SURGICAL HISTORY: Inguinal hernia repair.   ALLERGIES: No known drug allergies.   OUTPATIENT MEDICATIONS: Lasix as needed 20 mg daily, amlodipine 2.5 mg which the patient cuts into 1/4 and takes that per day. He is also on an antifungal medication likely for toe  infection.   FAMILY HISTORY: Mom and sister with cancer, a brother with heart disease.   SOCIAL HISTORY: Married, lives at home. No tobacco, alcohol or drug use.   REVIEW OF SYSTEMS:   CONSTITUTIONAL: No fever, fatigue, weakness or weight changes.  EYES: No blurry vision or double vision.  ENT: No tinnitus, hearing loss or ear pain.  RESPIRATORY: No cough, wheezing, shortness of breath.  CARDIOVASCULAR: No chest pain. No orthopnea. No swellings in the legs. He has history of hypertension and possible blocked arteries in his heart. No history of MI or CHF.  GASTROINTESTINAL: No nausea, vomiting, diarrhea, abdominal pain, melena or dark stools.  ENDOCRINE: No polyuria or nocturia.  Might have a thyroid issue that is being worked up by Dr. Randel Books as an outpatient.  HEMATOLOGIC: History of anemia per chart. No easy bruising or bleeding. SKIN: No rashes.  MUSCULOSKELETAL: Has currently hip pain on the right.  NEUROLOGICAL: Has history of TIAs in the past.  PSYCHIATRIC: No anxiety or insomnia.   PHYSICAL EXAMINATION: VITAL SIGNS: On arrival, temperature was 97.8, pulse rate was 46, respiratory rate 20, blood pressure 144/81. O2 sat was 100% on room air.  GENERAL: The patient is a Caucasian male lying in bed in moderate distress, a little anxious.  HEENT: Normocephalic, atraumatic. Pupils are equal and reactive. Anicteric sclerae. Extraocular muscles are intact. Moist mucous membranes.  NECK: Supple. No thyroid tenderness. No cervical lymphadenopathy.  CARDIOVASCULAR: S1, S2, bradycardic. No significant murmurs appreciated.  LUNGS: Clear to auscultation anteriorly, did not auscultate posteriorly as the patient has severe pain with slight movements.  ABDOMEN: Soft, nontender, nondistended. Positive bowel sounds in all quadrants.  EXTREMITIES: No significant pitting edema.  SKIN: Does not have any obvious rashes. The patient does have some greenish tinge on the left great toe without any drainage or evidence for cellulitis.  NEUROLOGICAL: Cranial nerves appear to be grossly intact. Strength is 5 out of 5 in upper extremities, did not do lower extremities. The patient has significant pain on any mobility.  PSYCHIATRIC: Awake, alert, oriented x 3, anxious.   LABS AND IMAGING:  AP x-ray of pelvis shows comminuted right intertrochanteric fracture without dislocation. Right hip complete shows right hip comminuted and displaced right intertrochanteric  fracture without dislocation.  Chest x-ray, 1 view: No acute disease of the chest. Glucose 112, BUN 44, potassium is 4.3, creatinine 2, sodium 135, GFR of 30. LFTs show alk phos of 249, otherwise within normal limits. WBC 8.3,  hemoglobin 14.3, platelets are 179.   EKG: Marked sinus bradycardia, rate is 44. Left axis deviation with incomplete right bundle branch block. There are marked Q waves in inferior leads as well as V1, V3, V4. No acute ST elevations or depressions.   ASSESSMENT AND PLAN: We have an 81 year old with hypertension, history of some blockages in the heart, gout, status post fall and right hip fracture. Hospitalist services were contacted for admission.  We will admit the patient to the hospitalist service. Dr. Marry Guan has been notified already. The patient has no chest pains, shortness of breath, but has significant abnormal EKG with Q waves and marked bradycardia. He is not on a beta blocker. He does have a cardiologist, and [facility] has been paged for preop evaluation given the abnormal EKG and possible blocked arteries for further assessment before going to the OR.  Otherwise, he does have good functional capacity and walks, per him, 45 minutes without having any symptoms. We would defer to Cardiology for further workup, if any. The patient does have acute renal failure likely, and we would start the patient on some  gentle IV fluids. We would start him on morphine for pain. DVT prophylaxis per Orthopedics. We would hold amlodipine at this point as the blood pressures are not significantly elevated in the setting of pain, even, and he is on a miniscule dose of amlodipine. We would also admit the patient to the orthopedic unit and also add a remote telemonitor to monitor the bradycardia. I would start him on a diet at this point as, per ER physician, Dr. Marry Guan is planning on taking him likely to the OR tomorrow. I have placed a Cardiology consult. Further recommendations are to follow up with Cardiology in regards to preop evaluation.   TOTAL TIME SPENT: 50 minutes.    ____________________________ Vivien Presto, MD sa:cb D: 12/15/2012 16:29:14 ET T: 12/15/2012 16:52:06  ET JOB#: [id number]  cc: Vivien Presto, MD, <Dictator> Minna Merritts, MD Milinda Pointer Jacqualine Code, MD Karel Jarvis [facility] MD ELECTRONICALLY SIGNED 01/02/2013 12:27

## 2014-09-01 NOTE — Consult Note (Signed)
Brief Consult Note: Diagnosis: Right pertrochanteric femur fracture.   Patient was seen by consultant.   Comments: Recommend ORIF of right pertrochanteric femur fracture. The risks and benefits of surgical intervention were discussed in detail with the patient. The patient expressed understanding of the risks and benefits and agreed with plans for surgery.  Surgical site signed as per "right site surgery" protocol.  Will tentatively schedule for surgery tomorrow.  Electronic Signatures: Dereck Leep (MD)  (Signed 06-Aug-14 23:54)  Authored: Brief Consult Note   Last Updated: 06-Aug-14 23:54 by Dereck Leep (MD)

## 2014-09-02 NOTE — Discharge Summary (Signed)
PATIENT NAME:  Ricky Vance, Ricky Vance MR#:  [id number] DATE OF BIRTH:  [birth year]  DATE OF ADMISSION:  07/15/2013 DATE OF DISCHARGE:  07/18/2013  DISCHARGE DIAGNOSES: 1.  Acute stroke, right-sided weakness.  2.  Hypertension.  3.  Chronic diastolic heart failure.  4.  Chronic kidney disease stage III. 5.  History of coronary artery disease.   CONDITION ON DISCHARGE: Stable.   MEDICATIONS ON DISCHARGE: 1.  Amlodipine 10 mg oral tablet once a day.  2.  Lisinopril 5 mg oral tablet once a day.  3.  Simvastatin 20 mg once a day.  4.  Aspirin 81 mg once a day.   DIET ON DISCHARGE: Low-sodium, low-fat, low-cholesterol, regular consistency diet.   ACTIVITY: As tolerated.   TIMEFRAME TO FOLLOW:  Within 1 to 2 weeks with primary physician, Dr. Ronette Deter.  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 There was no speech issue. The weakness was on the right side, upper and lower limb, so decided to come in the Emergency Room as the weakness continued. CAT scan of the head was done which was negative for any acute stroke and hospitalist service was contacted for further management.   HOSPITAL COURSE AND STAY:  On MRI the patient was found having acute infarct and so stroke work-up was done including carotid Doppler study and echocardiogram, which did not show any significant source of the stroke. He was started on aspirin and statin for further stroke prevention in the future and increased his blood pressure medication for better control of his blood pressure. Because of persistent weakness on right side of the body he was scheduled to have rehab services after discharge.   OTHER MEDICAL ISSUES: 1.  Hypertension. The patient was taking amlodipine 5 mg. We increased to 10 mg and added lisinopril.  2.  Chronic kidney disease,  stage III. Remained stable in the hospital. Baseline creatinine was around 1.3 to 1.4.  3.  Chronic diastolic heart failure. It was stable in the hospital. As per echocardiogram, there was impaired relaxation of left ventricle. We added lisinopril to his blood pressure medication for better control.  4.  History of 2 vessel disease in the heart, as per the patient, in the past. He was following with Dr. Rockey Situ and Fletcher Anon, was not taking any medication. I started him on aspirin and statin for stroke prevention, which also helps for the heart disease.   IMPORTANT LABORATORY AND DIAGNOSTIC RESULTS IN THE HOSPITAL: On admission, WBC 6.4, hemoglobin 13.3, and platelet count 250,000. Creatinine was 1.69, BUN 37, and potassium 3.9 on admission. Troponin less than 0.02. Chest x-ray, portable, showed no edema or consolidation. Urinalysis was negative. MRI of the brain showed small acute left coronary artery infarct. Carotid Doppler study: No hemodynamically significant internal carotid artery stenosis or significant atherosclerosis. Echocardiogram showed ejection fraction 60% to 65%, normal global left ventricular systolic function, impaired relaxation pattern of LV diastolic filling. Cholesterol 140, triglycerides 72, HDL 34, and LDL was 92. Creatinine was 1.83 at the time of discharge.   TOTAL TIME SPENT ON THIS DISCHARGE: 40 minutes. ____________________________ Ceasar Lund Anselm Jungling, MD vgv:sb D: 07/18/2013 08:42:36 ET T: 07/18/2013 09:21:56 ET JOB#: [id number]  cc: Ceasar Lund. Anselm Jungling, MD, <Dictator> Eduard Clos. Gilford Rile, MD Vaughan Basta MD ELECTRONICALLY SIGNED 07/26/2013 22:45

## 2014-09-02 NOTE — Consult Note (Signed)
Details:   - It is ok to draw the Hgb while prbc is still running.   Electronic Signatures: Arther Dames (MD)  (Signed 02-Sep-15 15:32)  Authored: Details   Last Updated: 02-Sep-15 15:32 by Arther Dames (MD)

## 2014-09-02 NOTE — H&P (Signed)
PATIENT NAME:  Ricky Vance, Ricky Vance MR#:  [id number] DATE OF BIRTH:  16-Sep-1927  DATE OF ADMISSION:  01/10/2014  PRIMARY CARE PHYSICIAN:  Dr. Randel Books.   PRIMARY CARDIOLOGIST: Dr. Rockey Situ.   REFERRING: Emergency Room physician, Dr. Thomasene Lot.   CHIEF COMPLAINT: Hematemesis.   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 The patient is unable to give much history. He does not know how long this has been occurring. He states that he has seen bright red blood in his vomit. He states that this does not occur every day and he does not know how long it has been occurring. He denies any abdominal pain, nausea. He denies any diarrhea. He states that he has not noticed any dark, tarry stools, but he does not look at his stools. Hemoglobin has gone from 13 to 8.4 over the past 6 months.   PAST MEDICAL HISTORY: 1.  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  2.  Diastolic congestive heart failure with preserved ejection fraction of 60% to 65%.  3.  Hypertension.  4.  Chronic kidney disease stage III with a baseline creatinine of about 1.8.  5.  History of interstitial nephritis.  6.  History of benign prostatic hypertrophy.  7.  Anxiety.  8.  Gastroesophageal reflux disease.  9.  Gout.  10.   History of hip fracture.   PAST SURGICAL HISTORY: 1.  Hernia repair in May 2015.  2.  ORIF right femur fracture August 2014.  3.  Cataract surgery February 2015.   SOCIAL HISTORY: The patient is a resident at [facility]. He is a retired English as a second language teacher. He states that he does not have any local family. He is married. He states that his wife is living, but has problems with her memory. Denies alcohol, tobacco, or any other  substance abuse.   FAMILY HISTORY: Mother and sister had cancer; he does not know what type of cancer. His brother died with heart disease.   HOME MEDICATIONS: 1.  Voltaren topical 1% gel apply topically to affected area 4 times a day for pain to the right shoulder.  2.  Trazodone 50 mg 1 tablet once a day at bedtime.  3.  Sorbitol 70% oral liquid 30 mL every 6 hours as needed.  4.  Simvastatin 20 mg 1 tablet daily at bedtime.  5.  Senna Plus 50 mg/8.6 mg oral tablet 2 tablets once a day at bedtime.  6.  Promethazine 25 mg oral tablet 1 tablet every 4 hours as needed for nausea and vomiting.  7.  Orajel 10% mucous membrane gel, apply to mucous membranes 4 times a day as needed for pain.  8.  MiraLax oral powder 17 grams orally once a day.  9.  Metoclopramide 5 mg 1 tablet 3 times a day with meals.  10.   Melatonin 3 mg 1 tablet once a day at bedtime.  11.   Magnesium citrate 1.745 grams/30 mL oral liquid 300 mL once a day as needed for constipation.  12.   Fleet enema 7 grams/19 grams rectal enema 133 mL rectally once every 3 days as needed for constipation not relieved by milk of magnesia or bisacodyl suppository.  13.   Citalopram 10 mg 1 tablet once a day.  14.   Diaper  dermatitis cream applied topically to affected area as needed.  15.   Clonazepam 0.5 mg, 0.5 tablets orally 2 times a day.  16.   Aspirin 81 mg once daily.  17.   Amlodipine 10 mg 1 tablet once a day.  18.   Acetaminophen/hydrocodone 325 mg/5 mg 1 tablet every 4 hours for pain as needed.   ALLERGIES: No known drug allergies.   REVIEW OF SYSTEMS: The patient is unable to give review of systems.   PHYSICAL EXAMINATION: VITAL SIGNS: Temperature 97.8, pulse 68, respirations 14, blood pressure 130/73, oxygenation 100% on room air.  GENERAL: The patient is alert, in no acute distress, sitting up in bed eating.  HEENT: Pupils are equal, round, and reactive. Conjunctivae are clear with no injection, no icterus. Extraocular  motion is intact. Mucous membranes are pink and moist. Good dentition.  NECK: No cervical lymphadenopathy. Trachea is midline. Thyroid is nontender. No thyroid nodule is noted.  PULMONARY: Lungs are clear to auscultation bilaterally with good air movement.  CARDIOVASCULAR: Regular rate and rhythm. There is a systolic ejection murmur. Peripheral pulses are 1+. No edema.  ABDOMEN: Bowel sounds are hyperactive. Abdomen is tense. He denies pain with palpation. There is guarding, no mass. No hepatosplenomegaly.  EXTREMITIES: Strength is weak on the right side. He has 3/5 strength in the right upper extremity. No motion of the right lower extremity. The right lower ankle is reported with a foam boot. Strength is normal on the left with 5/5 strength. No swollen or tender joints. Range of motion is normal.  NEUROLOGIC: As noted. There is a right-sided facial droop with drooling. Right-sided weakness of the upper and lower extremity. Sensation is intact. Patient is alert, oriented, very slow to answer questions. Short-term memory seems poor.   PSYCHIATRIC: The patient is clearly anxious. Mentions several times that he is very anxious, that he would like end his misery.   LABORATORY DATA: White blood cells 10.6, hemoglobin 8.4, platelets 287,000, MCV 91. Sodium 137, potassium 5.5, chloride 104, bicarbonate 25, BUN 56, creatinine 1.36, glucose 107. LFTs show elevated alkaline phosphatase at 127, decreased ALT of 11, decreased total protein at 6.1. Lipase is 111. INR is 1.2. Urinalysis shows no evidence of urinary tract infection.   ASSESSMENT AND PLAN: 1.  Gastrointestinal hemorrhage, likely upper gastrointestinal bleeding with hematemesis. Gastroenterology consulted and plan is for EGD in the morning. The patient is n.p.o. as of now. PPI has been started.  2.  Otitis externa on the right side, start Ciprodex drops.  3.  Hyperkalemia. No EKG changes. Provide hydration, continue to monitor. Recheck potassium this  afternoon.  4.  Chronic kidney disease,  stable. No acute renal injury.  5.  Hypertension, stable. Hold oral antihypertensives in the setting of gastrointestinal bleed. Provide IV hydralazine as needed.  6.  Anxiety and insomnia. The patient is clearly very anxious during examination. Continue home regimen of melatonin, trazodone, Klonopin, and Lexapro.  7.  CVA with right-sided hemiparesis. Will obtain a physical therapy consultation during this admission. He seems stable in his right-sided weakness.  8.  Diastolic failure. Preserved ejection fraction of 60% to 65%. No current signs of failure. Provide gentle hydration and monitor closely.  9.  Prophylaxis. Sequential compression devices in the setting of gastrointestinal bleed. PPI provided.   TIME SPENT ON ADMISSION: 45 minutes.     ____________________________ Earleen Newport. Volanda Napoleon, MD cpw:at D: 01/10/2014 14:13:02 ET T: 01/10/2014 15:07:29 ET JOB#: [id number]  cc: Barnetta Chapel P. Volanda Napoleon, MD, <Dictator> Brady Schiller  Christen Butter MD ELECTRONICALLY SIGNED 01/10/2014 21:31

## 2014-09-02 NOTE — Consult Note (Signed)
Details:   - Hgb is very stable after transfusion.  No active bleeding since admission.  EGD is cancelled.  Would only plan to perform if absolutely necessary in setting of evidence of active bleeding. Will start clear liquids.   Electronic Signatures: Arther Dames (MD)  (Signed 03-Sep-15 07:48)  Authored: Details   Last Updated: 03-Sep-15 07:48 by Arther Dames (MD)

## 2014-09-02 NOTE — Discharge Summary (Signed)
PATIENT NAME:  Ricky Vance, CHALOUX MR#:  [id number] DATE OF BIRTH:  1927/08/19  DATE OF ADMISSION:  01/10/2014 DATE OF DISCHARGE:  01/13/2014  PRIMARY CARE PHYSICIAN: Frazier Richards, MD   FINAL DIAGNOSES: 1.  Acute gastrointestinal hemorrhage.  2.  Otitis externa.  3.  Hyperkalemia.  4.  Hypertension.  5.  History of diastolic congestive heart failure. No signs on this hospital stay. 6.  History of stroke with right-sided weakness.   MEDICATIONS ON DISCHARGE: Include simvastatin 20 mg at bedtime, amlodipine 10 mg daily, melatonin 3 mg at bedtime, diaper dermatitis cream applied to effected area at bedtime, Lexapro 10 mg daily, MiraLax oral powder for reconstitution 17 grams once a day, Orajel 10% mucous membrane gel applied to mucous membranes 4 times a day as needed for pain, promethazine 25 mg every 4 hours as needed for nausea and vomiting, senna plus 50/8.6 two  tablets once a day at bedtime, trazodone 50 mg at bedtime, Voltaren topical 1% gel apply to effected area 4 times a day for pain to right shoulder, acetaminophen/hydrocodone 325/5 one tablet every 4 hours as needed for pain, clonazepam 0.5 mg twice a day, ciprofloxacin/dexamethasone otic 4 drops four times a day x5 days to the right ear, Protonix 40 mg twice a day, ferrous sulfate 325 mg daily.   DISCHARGE INSTRUCTIONS: Stop taking aspirin. The family wishes for hospice screen at the facility.   DISCHARGE DIET: Regular consistency, low-sodium, as tolerated.   DISCHARGE FOLLOWUP: In 1 to 2 days with Dr. Frazier Richards.   REASON FOR ADMISSION: The patient was admitted January 10, 2014 and discharged January 13, 2014. The patient came in on January 10, 2014 with hematemesis and was admitted to the hospital for gastrointestinal hemorrhage. Started on Protonix drip. For otitis externa started on Ciprodex. For hyperkalemia the patient was given IV fluid hydration.   DIAGNOSTIC DATA: Lipase 111. Glucose 107, BUN 56, creatinine 1.36,  sodium 137, potassium 5.5, chloride 104, CO2 25, calcium 8.6. Liver function tests: Alkaline phosphatase 127, ALT 11, AST 20. White blood cell count 10.6, H and H 8.4 and 25.7, platelet count 287,000. Urinalysis negative. Glucose 94, BUN 62, creatinine 1.42, sodium 138, potassium 4.7, chloride 104, CO2 26, calcium 8.5. White blood cell count 8.3, H and H 7.4 and 22.6, platelet count 254,000. Hemoglobin dipped down to 6.1. The patient was transfused 2 units of packed red blood cells, hemoglobin up to 9.5 and then drifted down to 8.7.   HOSPITAL COURSE PER PROBLEM LIST:  1.  For the patient's acute gastrointestinal hemorrhage, the patient was seen in consultation by Dr. Rayann Heman. Family did not want any procedures done. Hemoglobin stable after transfusion. The patient was on IV Protonix the entire hospital course and will switch over to oral Protonix and start ferrous sulfate once a day. The family wishes hospice care over at the facility. I do recommend checking up hemoglobin every couple of weeks depending if family wants to do this or not.  2.  Otitis externa. We will continue Ciprodex for 5 days.  3.  Hyperkalemia. This was treated with IV fluid hydration.  4.  Hypertension. Can go back on amlodipine.  5.  History of diastolic congestive heart failure. No signs on this hospital stay. The patient received IV fluids during the entire hospital stay.  6.  History of stroke with right-sided paralysis. No aspirin at this time secondary to gastrointestinal bleed.  7.  Chronic kidney disease. Creatinine upon discharge 1.35 making this chronic kidney disease,  stage III.   TIME SPENT ON DISCHARGE: 35 minutes.  ____________________________ Tana Conch. Leslye Peer, MD rjw:sb D: 01/13/2014 09:59:03 ET T: 01/13/2014 10:15:10 ET JOB#: [id number]  cc: Tana Conch. Leslye Peer, MD, <Dictator> Ocie Cornfield. Ouida Sills, [place] MD ELECTRONICALLY SIGNED 01/13/2014 15:09

## 2014-09-02 NOTE — Discharge Summary (Signed)
PATIENT NAME:  Ricky Vance, Ricky Vance MR#:  [id number] DATE OF BIRTH:  [birth year]  DATE OF ADMISSION:  09/28/2013 DATE OF DISCHARGE:  09/30/2013  BRIEF HISTORY:  [name] is an 81 year old gentleman with an incarcerated inguinal hernia. He was evaluated in the Emergency Room with sudden enlargement of his known right inguinal hernia. He did not have any evidence of obstruction. He did not have any evidence of strangulation. After appropriate preoperative informed consent, he was taken to surgery on the morning of 09/29/2013, where he underwent an open repair of his recurrent incarcerated inguinal hernia. The procedure was uncomplicated. He had no significant intraoperative problems. He had a Bard polypropylene precut mesh placed in the inguinal floor.  The procedure was uncomplicated. This morning, he is up, active, tolerating a diet. He is anxious for discharge back to the assisted living facility. He will be discharge this morning and will follow in the office in 7 to 10 days' time. Bathing, activity and driving instructions were given to the patient. He is to resume his home medications of lisinopril 5 mg p.o. daily, simvastatin 20 mg p.o. daily, aspirin 81 mg p.o. daily, amlodipine 10 mg p.o. daily, melatonin 3 mg at bedtime, senna 50/8.6, 2 tablets once a day and Vicodin 5/325 every 4 to 6 hours p.r.n.   FINAL DISCHARGE DIAGNOSIS:  Incarcerated right inguinal hernia.   SURGERY PERFORMED: Right inguinal hernia repair.    ____________________________ Micheline Maze, MD rle:dmm D: 09/30/2013 10:42:02 ET T: 09/30/2013 11:00:37 ET JOB#: [id number]  cc: Micheline Maze, MD, <Dictator> Eduard Clos. Gilford Rile, MD Rodena Goldmann MD ELECTRONICALLY SIGNED 10/01/2013 10:13

## 2014-09-02 NOTE — H&P (Signed)
PATIENT NAME:  Ricky Vance, Ricky Vance MR#:  [id number] DATE OF BIRTH:  January 16, 1928  DATE OF ADMISSION:  09/28/2013  HISTORY OF PRESENT ILLNESS: Mr. Deneault is an 81 year old white male who noticed a sudden enlargement of his right inguinal hernia today associated with pain and some nausea. He has not vomited and he has continued to pass flatus during the day. He states that his right inguinal hernia was repaired in the remote past and that he has seen physicians for it but that it has never been problematic until today.   PAST MEDICAL HISTORY: Recent right dense paresis of the upper extremity due to an acute cerebrovascular accident (July 15, 2013), hypertension, diastolic congestive heart failure, chronic renal insufficiency (baseline creatinine 1.8), coronary artery disease, gout, status post hip replacement.   HOME MEDICATIONS: Simvastatin 20 mg q.p.m., senna-S 50/8.6 two q.p.m., melatonin 3 mg  at bedtime, lisinopril 5 mg q.a.m., aspirin 81 mg daily, amlodipine 10 mg daily, acetaminophen 650 mg t.i.d.   ALLERGIES: None.    SOCIAL HISTORY: The patient lives in an assisted living facility alone. He is a retired English as a second language teacher and he says that he can walk with assistance and a walker some. He does not smoke cigarettes or use alcohol or use illicit drugs.   FAMILY HISTORY: Noncontributory.   REVIEW OF SYSTEMS: Negative for 10 systems except as mentioned in the history of present illness above.   PHYSICAL EXAMINATION: GENERAL: Revealed a very pleasant, intelligent, elderly male lying comfortably on the stretcher with a Depends diaper in place.  VITAL SIGNS:  Temperature 98.5, pulse 61, respirations 16, blood pressure 145/72, oxygen saturation 96% on room air at rest.  HEENT: Pupils equally round and reactive to light. Extraocular movements intact. Sclerae anicteric. Oropharynx clear. Mucous membranes moist. Hearing intact to voice, although he does have some hearing loss.  NECK: Supple with no tracheal deviation  or jugular venous distention.  HEART: Regular rate and rhythm with no murmurs or rubs.  LUNGS: Clear to auscultation with normal respiratory effort bilaterally.  ABDOMEN: Soft, nontender, nondistended with no palpable hepatosplenomegaly or other masses.  GENITOURINARY: Reveals normal bilateral testicles with a fairly large but soft right inguinal hernia that appears to have a large hernia defect but it is irreducible.  EXTREMITIES: No edema with normal capillary refill bilaterally.  PSYCHIATRIC: Alert and oriented x 4. Appropriate affect.  NEUROLOGIC: The patient has a dense right upper extremity hemiparesis and is somewhat slow with answering some questions but has no apparent memory deficit   ASSESSMENT AND PLAN: Acute enlargement of a chronic recurrent right inguinal hernia associated with pain and nausea. I do not believe that he has compromised intestine and, therefore, could undergo surgery urgently tomorrow.   PLAN: Essentially for comfort and at the patient's request, I have planned to admit the patient to the hospital with IV antibiotics and perform a semi-elective or urgent repair of his incarcerated recurrent right inguinal hernia tomorrow. The only significant blood thinner that he takes is aspirin and, although this may increase oozing somewhat, it does not prohibit surgery. I will obtain labs, EKG and chest x-ray this evening for the anesthesiologist to review.   ____________________________ Consuela Mimes, MD wfm:cs D: 09/28/2013 18:46:04 ET T: 09/28/2013 19:22:25 ET JOB#: [id number]  cc: Consuela Mimes, MD, <Dictator> Consuela Mimes MD ELECTRONICALLY SIGNED 09/29/2013 16:34

## 2014-09-02 NOTE — Consult Note (Signed)
Details:   - Second unit of prbc still infusing.  Anesthesia requires repeat Hgb prior to procedure .  Since he has not had any active bleeding and hemodynamics look very good, will postpone the procedure until tomorrow morning at 7:30 am.     Recs: - repeat Hgb after second unit.  - if Hgb > 7, ok to give clear liquids for dinner, then npo after midnight.  - cont PPI drip.  - EGD at 7:30 am.   Electronic Signatures: Arther Dames (MD)  (Signed 02-Sep-15 15:50)  Authored: Details   Last Updated: 02-Sep-15 15:50 by Arther Dames (MD)

## 2014-09-02 NOTE — Consult Note (Signed)
PATIENT NAME:  Ricky Vance, Ricky Vance MR#:  [id number] DATE OF BIRTH:  06/05/1927  DATE OF CONSULTATION:  01/10/2014  CONSULTING PHYSICIAN:  Corky Sox. Zettie Pho, PA-C  ATTENDING GASTROENTEROLOGIST: Dr. Rayann Heman.  REFERRING PHYSICIAN: [facility].  HOSPITALIST: Dr. Volanda Napoleon.  REASON FOR CONSULTATION: Hematemesis.   HISTORY OF PRESENT ILLNESS: This is a pleasant 81 year old gentleman who resides at an assisted living facility who was initially brought to the hospital after the facility witnessed multiple episodes of hematemesis. Currently, his hemoglobin is 8.4 and his vital signs are stable but when looking back in his chart to several months ago, he has been found to have a hemoglobin of both 12 and 13. The patient reports to me that he personally has not seen any blood in his vomit but the assisted living facility did report multiple recurring episodes of blood that they had witnessed. No clear documentation of any melena. The patient denies any black or bloody stools. He states that he overall is feeling well and is denying any abdominal pain currently. He states that he has not vomited for the past several hours and he does have an appetite. He does have a recent history of a CVA in March 2015 and it is difficult to get a full history from him as he does seem like he gets easily confused, but he cannot tell me that he has ever had an EGD or colonoscopy in the past. He does have a history of GERD and indigestion, but he states that these symptoms have not been bothering him recently. He does report some worsening fatigue. No chest pain or shortness of breath. No fever or chills.   ALLERGIES: No known drug allergies.   PAST MEDICAL HISTORY: CVA with right hemiparesis March 2015, hypertension, coronary artery disease, chronic kidney disease, diastolic congestive heart failure, gout, GERD.   PAST SURGICAL HISTORY: Hip replacement and an incarcerated right inguinal hernia in May 2015.   SOCIAL HISTORY: The patient denies  any alcohol, tobacco, or illicit drug use. He does reside in assisted living facility.   MEDICATIONS: These are currently unavailable and the patient himself cannot let me know what his medications are and therefore I will review the list once the medications have been reconciled.   REVIEW OF SYSTEMS: A 10-system review of systems was obtained on the patient. Pertinent positives are mentioned above and otherwise negative.   OBJECTIVE:  VITAL SIGNS: Blood pressure 137/69, heart rate 79, respirations 18, temperature 97.3, bedside pulse oximetry is 96%.  GENERAL: This is a pleasant 81 year old gentleman resting quietly and comfortably in bed in no acute distress. Alert and oriented x 3.  HEAD: Atraumatic, normocephalic.  NECK: Supple. No lymphadenopathy noted.  HEENT: Sclerae anicteric. Mucous membranes moist.  LUNGS: Respirations are even and unlabored, clear to auscultation in bilateral anterior lung fields.  HEART: Regular rate and rhythm. S1, S2 noted.  ABDOMEN: Soft, nontender, nondistended. Normoactive bowel sounds noted in all 4 quadrants. No guarding or rebound. No masses, hernias, or organomegaly appreciated.  PSYCHIATRIC: Appropriate mood and affect.  RECTAL: Deferred.  NEUROLOGIC: I do see some residual effects of his recent CVA in his speech is very slow. He does seem easily confused.   LABORATORY DATA: White blood cells 10.6, hemoglobin 8.4, hematocrit 25.7, platelets 287,000. Sodium 137, potassium 5.5, BUN 56, creatinine 1.36, glucose 107. LFTs are within normal limits. MCV is 91. Lipase 111.   Imaging: No imaging has been performed yet during this hospitalization.   ASSESSMENT:  1. Anemia with a  hemoglobin of 8.4. It does appear that his baseline is somewhere between 12 and 13. MCV is normocytic at 91.  2. Hematemesis as witnessed by his assisted living facility reporting that he has had multiple episodes of vomiting blood  3. Recent cerebrovascular accident in March 2015  causing some residual right hemiparesis.   PLAN: I have discussed this patient's case in detail with Dr. Arther Dames who is involved in the development of the patient's plan of care. At this time, we do agree with starting the patient on PPI therapy. We also recommend keeping him n.p.o. status for now. We would like to keep a very close eye on his hemoglobin and monitor him for any recurrence of nausea and vomiting. We recommend symptomatic management with pain control and antiemetics as needed. Certainly due to his advanced age and recent CVA, we would have to weigh the risks and benefits carefully before deciding to proceed with an EGD. Depending on how he does with PPI therapy and how his hemoglobin responds, we may consider him for an EGD versus an upper GI series. These 2 options were outlined in detail for the patient, who verbalized understanding and is in agreement. We will continue to monitor this patient closely and make further recommendations pending above and per clinical course.   Thank you so much for this consultation and for allowing [place] to participate in the patient's plan of care.    ____________________________ Corky Sox. Si Jachim, PA-C kme:lt D: 01/10/2014 16:26:18 ET T: 01/10/2014 17:44:45 ET JOB#: [id number]  cc: Corky Sox. Bijon Mineer, PA-C, <Dictator> [place] PA ELECTRONICALLY SIGNED 01/11/2014 12:30

## 2014-09-02 NOTE — Op Note (Signed)
PATIENT NAME:  Ricky Vance, Ricky Vance MR#:  [id number] DATE OF BIRTH:  01-Jun-1927  DATE OF PROCEDURE:  09/29/2013  PREOPERATIVE DIAGNOSIS: Recurrent, incarcerated right inguinal hernia.   POSTOPERATIVE DIAGNOSIS: Recurrent, incarcerated right inguinal hernia.   PROCEDURE PERFORMED: Redo repair of incarcerated right inguinal hernia (Lichtenstein plus mesh plug).   SURGEON: Consuela Mimes, M.D.   ANESTHESIA: General.   PROCEDURE IN DETAIL: The patient was placed supine on the operating room table and prepped and draped in the usual sterile fashion. An incision was made in the lines of the skin overlying the inguinal canal and carried down through the subcutaneous tissue and Scarpa's fascia to a large hernia sac. The patient's hernia was reduced after general anesthesia was introduced. The hernia sac was dissected free of surrounding scar tissue until I was able to identify the edge of the conjoined tendon medially and the edge of the Poupart's ligament or the split open external oblique aponeurosis laterally. Once this was done, I entered the scar tissue and found the hernia sac proper and dissected this from surrounding cord elements. The testicle was reduced into the wound and it had a small hydrocele associated with it, but this was not a communicating hydrocele as it did not communicate with hernia sac. The patient underwent tedious dissection of the hernia sac from the remaining cord elements with some smaller vessels, (which were not the testicular artery or vein or vas deferens) ligated with 3-0 silk, but the majority of the hemostasis achieved with the electrocautery. The hernia sac was quite large. This was performed all the way down to the internal inguinal ring and it was noted that there was some folded or wadded up mesh medial to the internal ring or defect in the abdominal wall, and the hernia sac was densely adherent to this mesh.  I was able to dissect this off sharply with the Metzenbaum  scissors such that I could dunk the entire hernia sac back within the abdominal wall. After this was done, I placed a large mesh plug into the defect as it was only about 1.5 cm in diameter and this prevented any extrusion of hernia. I then placed a regular size Bard polypropylene precut keyhole mesh in the inguinal floor and secured it to the shelving edge of Poupart's ligament laterally with a running suture line of 2-0 Prolene. I placed 2 interrupted 2-0 Prolenes medially to the conjoined tendon and the old folded mesh such that the new mesh lay flat on the inguinal floor and the keyhole allowed the spermatic cord to emanate through it and the tails of the mesh were flat and crossed underneath the external oblique aponeurosis superolateral to the keyhole. I replaced the testicle in the scrotum and the spermatic cord in its anatomic position and reconstructed the external oblique aponeurosis, which had essentially split from superolateral to the internal inguinal ring all the way down to the scrotum. I did this from the superolateral aspect down and closed the external oblique aponeurosis over the inguinal canal and reconstructed the external inguinal ring with a running 2-0 Prolene suture. Interrupted 3-0 Monocryls were placed in Scarpa's fascia, and the skin was reapproximated with an Insorb stapler and 1 inch suture strip. The patient tolerated the procedure well. There were no complications.  ____________________________ Consuela Mimes, MD wfm:sb D: 09/29/2013 13:08:52 ET T: 09/29/2013 13:46:14 ET JOB#: [id number]  cc: Consuela Mimes, MD, <Dictator> Consuela Mimes MD ELECTRONICALLY SIGNED 09/29/2013 16:35

## 2014-09-02 NOTE — Consult Note (Signed)
   Comments   Spoke with pt's daughter, Keith Rake, who confirms she is HCPOA.  Daughter states that she has noticed her father's health is in decline but he is there mentally and can participate in his own decisions.Daughter states that she will be in the hospital early afternoon today and would like to speak with me then. Daughter does confirm that hospice at [facility] would be a good idea for pt.  Will speak more on this subject with daughter when she arrives.  Electronic Signatures: Sofiya Ezelle, Judith Part (NP)  (Signed 02-Sep-15 10:31)  Authored: Palliative Care Phifer, Izora Gala (MD)  (Signed 02-Sep-15 15:01)  Authored: Palliative Care   Last Updated: 02-Sep-15 15:01 by Phifer, Izora Gala (MD)

## 2014-09-02 NOTE — H&P (Signed)
PATIENT NAME:  Ricky Vance, Ricky Vance MR#:  [id number] DATE OF BIRTH:  05-24-1927  DATE OF ADMISSION:  07/14/2013  REFERRING PHYSICIAN:  Dr. Joni Fears.   PRIMARY CARE PHYSICIAN:  Dr. Jacqualine Code.   PRIMARY CARDIOLOGIST: Dr. Rockey Situ.   CHIEF COMPLAINT: Weakness.   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 He has been in usual state of health and noted to have some weakness on the right side yesterday upon awakening. He stated that he was up walking, going shopping and was doing okay the day prior. The weakness is on the right side. There are no speech issues. The wife is accompanying the patient. As the weakness continued, he came into the ER. Here, he has had a CAT scan of the head which was negative for acute stroke. Hospitalist services were contacted for further evaluation and management.   PAST MEDICAL HISTORY:  1.  CKD stage III.  2.  Hypertension.  3.  Gout.  4.  History of two-vessel heart block per him.  Sees Dr. Rosary Lively. Fletcher Anon.  5.  History of inguinal hernia repair.  6.  History of hip surgery.  7.  Patient had stroke, per him, in the past.   ALLERGIES: No known drug allergies.   OUTPATIENT MEDICATIONS: He takes amlodipine 5 mg daily.   SOCIAL HISTORY: Married. No tobacco, alcohol or drug use.   FAMILY HISTORY: Mom and sister with cancer. Brother with heart disease.     REVIEW OF SYSTEMS:  CONSTITUTIONAL: No fever, fatigue. Has right-sided weakness. No weight changes.  EYES: No blurry vision or double vision.  ENT: No tinnitus or hearing loss.  RESPIRATORY: No cough, wheezing, shortness of breath, COPD.  CARDIOVASCULAR: Has no chest pain, swelling in the legs. Has hypertension.  GASTROINTESTINAL: No nausea, vomiting, diarrhea, bloody stools, dark stools.  GENITOURINARY: Denies dysuria or hematuria.  HEMATOLOGIC AND LYMPHATIC: No anemia or easy bruising.  SKIN: No rashes.   MUSCULOSKELETAL: Denies arthritis or gout.  NEUROLOGIC: Has right-sided weakness. He states he has got history of strokes, but it is not  in his chart.  PSYCHIATRIC: Denies anxiety or insomnia.   PHYSICAL EXAMINATION: VITAL SIGNS: Temperature on arrival 97, pulse rate 60, respiratory rate 20, blood pressure 151/74, O2 sat 99% on room air.  GENERAL: The patient is a well-developed male lying in bed. No obvious distress. Talking in full sentences.  HEENT: Normocephalic, atraumatic. Pupils are equal and reactive. Anicteric sclerae. Moist mucous membranes.  NECK: Supple. No thyroid tenderness. No cervical lymphadenopathy.  CARDIOVASCULAR: S1, S2 regular. No significant murmurs appreciated.  LUNGS: Clear to auscultation without wheezing, rhonchi or rales.  ABDOMEN: Soft, nontender, nondistended. Positive bowel sounds in all quadrants.  EXTREMITIES: No pitting edema.  NEUROLOGIC: Cranial nerves II through XII appears to be grossly intact. Strength is 5/5 all extremities; however, the patient does have right-sided pronator drift and has abnormal finger-to-nose and rapid alternating movement testing.  PSYCHIATRIC:  He is awake, alert, oriented x 3. Pleasant and cooperative.  SKIN: No obvious lesions, but he does have some ecchymosis on the right upper extremity.   CAT SCAN OF THE HEAD: No acute intracranial pathology.    No edema or consolidation chest xray.   CBC within normal limits. Troponin negative. LFTs show alk phos 260, otherwise within normal limits. BUN 37, glucose 65, creatinine 1.69, sodium 135, potassium 3.9.   EKG: Sinus rhythm with first-degree AV block. Left anterior fascicular block, and  the patient does have Q waves in the septal leads. No acute ST elevations or depressions.   ASSESSMENT AND PLAN: We have an 81 year old with chronic kidney disease stage III, hypertension, gout and hip fracture last year, who comes in for weakness. I suspect the patient might have had a stroke. He  has right-sided pronator drift and abnormal finger-to-nose and rapid alternating movement testing. He is a little bit uncoordinated as well. Although his CAT scan is negative, we will see if we can get an MRI; however, with his recent hip surgery, we will see if  MRI will take him. We will do further workup including frequent neuro checks, ultrasound of the carotids and echocardiogram. Obtain lipid profile. He is not on an aspirin. I would put him on an aspirin and hold his blood pressure medications for now and allow for permissive hypertension. We will obtain PT consult as well and check a TSH and hemoglobin A1c.   In regards to his chronic kidney disease, this is stable, stage III.    I would start him on heparin for deep vein thrombosis prophylaxis.   The patient is DO NOT RESUSCITATE.   Total time spent is 40 minutes.     ____________________________ Vivien Presto, MD sa:dmm D: 07/14/2013 12:18:26 ET T: 07/14/2013 12:34:15 ET JOB#: [id number]  cc: Vivien Presto, MD, <Dictator> [place] Jacqualine Code, MD Karel Jarvis [facility] MD ELECTRONICALLY SIGNED 08/11/2013 15:38

## 2014-09-03 NOTE — Op Note (Signed)
PATIENT NAME:  Ricky Vance, Ricky Vance MR#:  [id number] DATE OF BIRTH:  11/19/1927  DATE OF PROCEDURE:  06/17/2011  PREOPERATIVE DIAGNOSIS: Visually significant cataract of the right eye.   POSTOPERATIVE DIAGNOSIS: Visually significant cataract of the right eye.   OPERATIVE PROCEDURE: Cataract extraction by phacoemulsification with implant of intraocular lens to right eye.   SURGEON: Birder Robson, MD.   ANESTHESIA:  1. Managed anesthesia care.  2. Topical tetracaine drops followed by 2% Xylocaine jelly applied in the preoperative holding area.   COMPLICATIONS: None.   TECHNIQUE:  Stop and chop.   DESCRIPTION OF PROCEDURE: The patient was examined and consented in the preoperative holding area where the aforementioned topical anesthesia was applied to the right eye and then brought back to the Operating Room where the right eye was prepped and draped in the usual sterile ophthalmic fashion and a lid speculum was placed. A paracentesis was created with the side port blade and the anterior chamber was filled with viscoelastic. A near clear corneal incision was performed with the steel keratome. A continuous curvilinear capsulorrhexis was performed with a cystotome followed by the capsulorrhexis forceps. Hydrodissection and hydrodelineation were carried out with BSS on a blunt cannula. The lens was removed in a stop and chop technique and the remaining cortical material was removed with the irrigation-aspiration handpiece. The capsular bag was inflated with viscoelastic and the Alcon SN60WF 20.0-diopter lens, serial number [id number] was placed in the capsular bag without complication. The remaining viscoelastic was removed from the eye with the irrigation-aspiration handpiece. The wounds were hydrated. The anterior chamber was flushed with Miostat and the eye was inflated to physiologic pressure. The wounds were found to be water tight. The eye was dressed with Vigamox and Omnipred. The patient was  given protective glasses to wear throughout the day and a shield with which to sleep tonight. The patient was also given drops with which to begin a drop regimen today and will follow-up with me in one day.  ____________________________ Livingston Diones. Marin Wisner, MD wlp:slb D: 06/17/2011 13:08:30 ET T: 06/17/2011 13:41:18 ET JOB#: [id number]  cc: Vicci Reder L. Jillian Warth, MD, <Dictator> Livingston Diones Alyxandria Wentz MD ELECTRONICALLY SIGNED 06/24/2011 12:47

## 2014-10-11 DEATH — deceased

## 2016-03-04 NOTE — Telephone Encounter (Signed)
error
